# Patient Record
Sex: Female | Born: 1945 | Race: White | Hispanic: No | Smoking: Never smoker
Health system: Southern US, Community
[De-identification: ages and names within clinical notes are randomized; demographics above are authoritative.]

## PROBLEM LIST (undated history)

## (undated) DIAGNOSIS — C169 Malignant neoplasm of stomach, unspecified: Secondary | ICD-10-CM

## (undated) DIAGNOSIS — K219 Gastro-esophageal reflux disease without esophagitis: Secondary | ICD-10-CM

## (undated) DIAGNOSIS — D638 Anemia in other chronic diseases classified elsewhere: Secondary | ICD-10-CM

## (undated) DIAGNOSIS — I10 Essential (primary) hypertension: Secondary | ICD-10-CM

## (undated) DIAGNOSIS — E785 Hyperlipidemia, unspecified: Secondary | ICD-10-CM

## (undated) DIAGNOSIS — I2510 Atherosclerotic heart disease of native coronary artery without angina pectoris: Secondary | ICD-10-CM

## (undated) DIAGNOSIS — C801 Malignant (primary) neoplasm, unspecified: Secondary | ICD-10-CM

## (undated) HISTORY — PX: JOINT REPLACEMENT: SHX530

## (undated) HISTORY — PX: ABDOMINAL HYSTERECTOMY: SHX81

## (undated) HISTORY — PX: OTHER SURGICAL HISTORY: SHX169

## (undated) HISTORY — PX: BREAST SURGERY: SHX581

## (undated) HISTORY — PX: KNEE ARTHROPLASTY: SHX992

---

## 2008-10-21 ENCOUNTER — Ambulatory Visit: Payer: Self-pay | Admitting: Internal Medicine

## 2008-11-09 ENCOUNTER — Ambulatory Visit: Payer: Self-pay | Admitting: Internal Medicine

## 2008-11-24 ENCOUNTER — Ambulatory Visit: Payer: Self-pay | Admitting: Surgery

## 2008-12-06 ENCOUNTER — Ambulatory Visit: Payer: Self-pay | Admitting: Surgery

## 2008-12-10 ENCOUNTER — Ambulatory Visit: Payer: Self-pay | Admitting: Surgery

## 2009-01-03 ENCOUNTER — Ambulatory Visit: Payer: Self-pay | Admitting: Surgery

## 2009-01-07 ENCOUNTER — Ambulatory Visit: Payer: Self-pay | Admitting: Surgery

## 2009-01-09 ENCOUNTER — Ambulatory Visit: Payer: Self-pay | Admitting: Oncology

## 2009-01-19 ENCOUNTER — Ambulatory Visit: Payer: Self-pay | Admitting: Oncology

## 2009-02-09 ENCOUNTER — Ambulatory Visit: Payer: Self-pay | Admitting: Oncology

## 2009-03-11 ENCOUNTER — Ambulatory Visit: Payer: Self-pay | Admitting: Oncology

## 2009-04-11 ENCOUNTER — Ambulatory Visit: Payer: Self-pay | Admitting: Oncology

## 2009-05-11 ENCOUNTER — Ambulatory Visit: Payer: Self-pay | Admitting: Oncology

## 2009-07-12 ENCOUNTER — Ambulatory Visit: Payer: Self-pay | Admitting: Oncology

## 2009-08-02 ENCOUNTER — Ambulatory Visit: Payer: Self-pay | Admitting: Oncology

## 2009-08-09 ENCOUNTER — Ambulatory Visit: Payer: Self-pay | Admitting: Oncology

## 2009-09-09 ENCOUNTER — Ambulatory Visit: Payer: Self-pay | Admitting: Radiation Oncology

## 2009-09-29 ENCOUNTER — Ambulatory Visit: Payer: Self-pay | Admitting: Oncology

## 2009-10-09 ENCOUNTER — Ambulatory Visit: Payer: Self-pay | Admitting: Radiation Oncology

## 2009-11-01 ENCOUNTER — Ambulatory Visit: Payer: Self-pay | Admitting: Oncology

## 2009-11-09 ENCOUNTER — Ambulatory Visit: Payer: Self-pay | Admitting: Radiation Oncology

## 2009-11-09 ENCOUNTER — Ambulatory Visit: Payer: Self-pay | Admitting: Oncology

## 2010-01-09 ENCOUNTER — Ambulatory Visit: Payer: Self-pay | Admitting: Oncology

## 2010-02-02 ENCOUNTER — Ambulatory Visit: Payer: Self-pay | Admitting: Oncology

## 2010-02-09 ENCOUNTER — Ambulatory Visit: Payer: Self-pay | Admitting: Oncology

## 2010-08-15 ENCOUNTER — Ambulatory Visit: Payer: Self-pay | Admitting: Oncology

## 2010-08-17 ENCOUNTER — Ambulatory Visit: Payer: Self-pay | Admitting: Oncology

## 2010-09-10 ENCOUNTER — Ambulatory Visit: Payer: Self-pay | Admitting: Oncology

## 2010-10-10 ENCOUNTER — Ambulatory Visit: Payer: Self-pay | Admitting: Oncology

## 2011-02-20 ENCOUNTER — Ambulatory Visit: Payer: Self-pay | Admitting: Oncology

## 2011-03-12 ENCOUNTER — Ambulatory Visit: Payer: Self-pay | Admitting: Oncology

## 2011-08-20 ENCOUNTER — Ambulatory Visit: Payer: Self-pay | Admitting: Oncology

## 2011-08-21 ENCOUNTER — Ambulatory Visit: Payer: Self-pay | Admitting: Oncology

## 2011-09-10 ENCOUNTER — Ambulatory Visit: Payer: Self-pay | Admitting: Oncology

## 2011-10-10 ENCOUNTER — Ambulatory Visit: Payer: Self-pay | Admitting: Oncology

## 2012-02-19 ENCOUNTER — Ambulatory Visit: Payer: Self-pay | Admitting: Oncology

## 2012-03-11 ENCOUNTER — Ambulatory Visit: Payer: Self-pay | Admitting: Oncology

## 2012-08-09 ENCOUNTER — Ambulatory Visit: Payer: Self-pay | Admitting: Oncology

## 2012-09-09 ENCOUNTER — Ambulatory Visit: Payer: Self-pay | Admitting: Oncology

## 2012-10-09 ENCOUNTER — Ambulatory Visit: Payer: Self-pay | Admitting: Oncology

## 2013-03-10 ENCOUNTER — Ambulatory Visit: Payer: Self-pay | Admitting: Oncology

## 2013-04-02 ENCOUNTER — Ambulatory Visit: Payer: Self-pay | Admitting: Oncology

## 2014-04-05 ENCOUNTER — Ambulatory Visit: Payer: Self-pay | Admitting: Oncology

## 2014-04-09 ENCOUNTER — Ambulatory Visit: Payer: Self-pay | Admitting: Oncology

## 2014-04-11 ENCOUNTER — Ambulatory Visit: Payer: Self-pay | Admitting: Oncology

## 2015-03-05 ENCOUNTER — Encounter: Payer: Self-pay | Admitting: *Deleted

## 2015-03-05 ENCOUNTER — Emergency Department
Admission: EM | Admit: 2015-03-05 | Discharge: 2015-03-06 | Disposition: A | Discharge status: Home / Self Care | Payer: Medicare Other | Attending: Emergency Medicine | Admitting: Emergency Medicine

## 2015-03-05 DIAGNOSIS — N390 Urinary tract infection, site not specified: Secondary | ICD-10-CM | POA: Insufficient documentation

## 2015-03-05 DIAGNOSIS — R1110 Vomiting, unspecified: Secondary | ICD-10-CM

## 2015-03-05 DIAGNOSIS — K2970 Gastritis, unspecified, without bleeding: Secondary | ICD-10-CM | POA: Diagnosis not present

## 2015-03-05 DIAGNOSIS — C787 Secondary malignant neoplasm of liver and intrahepatic bile duct: Secondary | ICD-10-CM | POA: Diagnosis not present

## 2015-03-05 DIAGNOSIS — I10 Essential (primary) hypertension: Secondary | ICD-10-CM | POA: Diagnosis not present

## 2015-03-05 DIAGNOSIS — R1011 Right upper quadrant pain: Secondary | ICD-10-CM | POA: Diagnosis present

## 2015-03-05 DIAGNOSIS — R109 Unspecified abdominal pain: Secondary | ICD-10-CM

## 2015-03-05 HISTORY — DX: Malignant (primary) neoplasm, unspecified: C80.1

## 2015-03-05 HISTORY — DX: Essential (primary) hypertension: I10

## 2015-03-05 LAB — COMPREHENSIVE METABOLIC PANEL
ALT: 19 U/L (ref 14–54)
AST: 22 U/L (ref 15–41)
Albumin: 2.8 g/dL — ABNORMAL LOW (ref 3.5–5.0)
Alkaline Phosphatase: 199 U/L — ABNORMAL HIGH (ref 38–126)
Anion gap: 7 (ref 5–15)
BILIRUBIN TOTAL: 1 mg/dL (ref 0.3–1.2)
BUN: 9 mg/dL (ref 6–20)
CO2: 25 mmol/L (ref 22–32)
Calcium: 8.8 mg/dL — ABNORMAL LOW (ref 8.9–10.3)
Chloride: 105 mmol/L (ref 101–111)
Creatinine, Ser: 0.56 mg/dL (ref 0.44–1.00)
GFR calc Af Amer: >60 mL/min (ref >60)
Glucose, Bld: 117 mg/dL — ABNORMAL HIGH (ref 65–99)
Potassium: 3.7 mmol/L (ref 3.5–5.1)
Sodium: 137 mmol/L (ref 135–145)
TOTAL PROTEIN: 7.3 g/dL (ref 6.5–8.1)

## 2015-03-05 LAB — URINALYSIS COMPLETE WITH MICROSCOPIC (ARMC ONLY)
BILIRUBIN URINE: NEGATIVE
Bacteria, UA: NONE SEEN
GLUCOSE, UA: NEGATIVE mg/dL
Nitrite: NEGATIVE
Protein, ur: 30 mg/dL — AB
Specific Gravity, Urine: 1.024 (ref 1.005–1.030)
pH: 5 (ref 5.0–8.0)

## 2015-03-05 LAB — LIPASE, BLOOD: LIPASE: 12 U/L — AB (ref 22–51)

## 2015-03-05 MED ORDER — KETOROLAC TROMETHAMINE 60 MG/2ML IM SOLN
INTRAMUSCULAR | Status: AC
  Filled 2015-03-05: qty 2

## 2015-03-05 MED ORDER — DIPHENHYDRAMINE HCL 50 MG PO CAPS
ORAL_CAPSULE | ORAL | Status: AC
  Filled 2015-03-05: qty 1

## 2015-03-05 MED ORDER — ORPHENADRINE CITRATE 30 MG/ML IJ SOLN
INTRAMUSCULAR | Status: AC
  Filled 2015-03-05: qty 2

## 2015-03-05 MED ORDER — METOCLOPRAMIDE HCL 10 MG PO TABS
ORAL_TABLET | ORAL | Status: AC
  Filled 2015-03-05: qty 1

## 2015-03-05 NOTE — ED Notes (Signed)
Pt reports abdominal pain, radiating to back. Pt and family believe it is her gallbladder. Reports pain 2-3 weeks. Vomiting.

## 2015-03-06 ENCOUNTER — Emergency Department: Payer: Medicare Other

## 2015-03-06 ENCOUNTER — Encounter: Payer: Self-pay | Admitting: Emergency Medicine

## 2015-03-06 LAB — CBC WITH DIFFERENTIAL/PLATELET
Basophils Absolute: 0.1 10*3/uL (ref 0–0.1)
Eosinophils Absolute: 0.1 10*3/uL (ref 0–0.7)
HEMATOCRIT: 27.8 % — AB (ref 35.0–47.0)
Hemoglobin: 8.9 g/dL — ABNORMAL LOW (ref 12.0–16.0)
Lymphs Abs: 2 10*3/uL (ref 1.0–3.6)
MCH: 25.6 pg — ABNORMAL LOW (ref 26.0–34.0)
MCHC: 31.9 g/dL — AB (ref 32.0–36.0)
MCV: 80.4 fL (ref 80.0–100.0)
MONO ABS: 3.7 10*3/uL — AB (ref 0.2–0.9)
NEUTROS ABS: 18.2 10*3/uL — AB (ref 1.4–6.5)
Neutrophils Relative %: 76 %
PLATELETS: 395 10*3/uL (ref 150–440)
RBC: 3.46 MIL/uL — ABNORMAL LOW (ref 3.80–5.20)
RDW: 17.3 % — AB (ref 11.5–14.5)
WBC: 24.1 10*3/uL — ABNORMAL HIGH (ref 3.6–11.0)

## 2015-03-06 MED ORDER — BENZONATATE 100 MG PO CAPS: 100.0000 mg | ORAL_CAPSULE | Freq: Four times a day (QID) | ORAL | Status: DC | PRN

## 2015-03-06 MED ORDER — BENZONATATE 100 MG PO CAPS
100.0000 mg | ORAL_CAPSULE | Freq: Once | ORAL | Status: AC
  Administered 2015-03-06: 100 mg via ORAL
  Filled 2015-03-06: qty 1

## 2015-03-06 MED ORDER — CEPHALEXIN 500 MG PO CAPS: 500.0000 mg | ORAL_CAPSULE | Freq: Four times a day (QID) | ORAL | Status: AC

## 2015-03-06 MED ORDER — IOHEXOL 300 MG/ML  SOLN
100.0000 mL | Freq: Once | INTRAMUSCULAR | Status: AC | PRN
  Administered 2015-03-06: 100 mL via INTRAVENOUS

## 2015-03-06 MED ORDER — DEXTROSE 5 % IV SOLN
1.0000 g | Freq: Once | INTRAVENOUS | Status: AC
  Administered 2015-03-06: 1 g via INTRAVENOUS
  Filled 2015-03-06: qty 10

## 2015-03-06 MED ORDER — ONDANSETRON 4 MG PO TBDP: 4.0000 mg | ORAL_TABLET | Freq: Three times a day (TID) | ORAL | Status: DC | PRN

## 2015-03-06 MED ORDER — HYDROCODONE-ACETAMINOPHEN 5-325 MG PO TABS: 1.0000 | ORAL_TABLET | Freq: Four times a day (QID) | ORAL | Status: DC | PRN

## 2015-03-06 MED ORDER — ONDANSETRON HCL 4 MG/2ML IJ SOLN
4.0000 mg | Freq: Once | INTRAMUSCULAR | Status: AC
  Administered 2015-03-06: 4 mg via INTRAVENOUS
  Filled 2015-03-06: qty 2

## 2015-03-06 MED ORDER — IOHEXOL 240 MG/ML SOLN
25.0000 mL | Freq: Once | INTRAMUSCULAR | Status: AC | PRN
  Administered 2015-03-06: 25 mL via ORAL

## 2015-03-06 MED ORDER — SODIUM CHLORIDE 0.9 % IV BOLUS (SEPSIS)
1000.0000 mL | Freq: Once | INTRAVENOUS | Status: AC
  Administered 2015-03-06: 1000 mL via INTRAVENOUS

## 2015-03-06 MED ORDER — MORPHINE SULFATE (PF) 4 MG/ML IV SOLN
4.0000 mg | Freq: Once | INTRAVENOUS | Status: AC
  Administered 2015-03-06: 2 mg via INTRAVENOUS
  Filled 2015-03-06: qty 1

## 2015-03-06 NOTE — ED Notes (Signed)
Pt reports upper abd pain x 2-3 weeks.  Area tender to touch around epigastric area.  Pain described as sharp.  Pt denies diarrhea, reports n/v.  Pt NAD at this time.

## 2015-03-06 NOTE — Discharge Instructions (Signed)
Abdominal Pain Many things can cause abdominal pain. Usually, abdominal pain is not caused by a disease and will improve without treatment. It can often be observed and treated at home. Your health care provider will do a physical exam and possibly order blood tests and X-rays to help determine the seriousness of your pain. However, in many cases, more time must pass before a clear cause of the pain can be found. Before that point, your health care provider may not know if you need more testing or further treatment. HOME CARE INSTRUCTIONS  Monitor your abdominal pain for any changes. The following actions may help to alleviate any discomfort you are experiencing:  Only take over-the-counter or prescription medicines as directed by your health care provider.  Do not take laxatives unless directed to do so by your health care provider.  Try a clear liquid diet (broth, tea, or water) as directed by your health care provider. Slowly move to a bland diet as tolerated. SEEK MEDICAL CARE IF:  You have unexplained abdominal pain.  You have abdominal pain associated with nausea or diarrhea.  You have pain when you urinate or have a bowel movement.  You experience abdominal pain that wakes you in the night.  You have abdominal pain that is worsened or improved by eating food.  You have abdominal pain that is worsened with eating fatty foods.  You have a fever. SEEK IMMEDIATE MEDICAL CARE IF:   Your pain does not go away within 2 hours.  You keep throwing up (vomiting).  Your pain is felt only in portions of the abdomen, such as the right side or the left lower portion of the abdomen.  You pass bloody or black tarry stools. MAKE SURE YOU:  Understand these instructions.   Will watch your condition.   Will get help right away if you are not doing well or get worse.  Document Released: 03/07/2005 Document Revised: 06/02/2013 Document Reviewed: 02/04/2013 [facility] Patient Information  2015 [place], [place]. This information is not intended to replace advice given to you by your health care provider. Make sure you discuss any questions you have with your health care provider.  Gastritis, Adult Gastritis is soreness and swelling (inflammation) of the lining of the stomach. Gastritis can develop as a sudden onset (acute) or long-term (chronic) condition. If gastritis is not treated, it can lead to stomach bleeding and ulcers. CAUSES  Gastritis occurs when the stomach lining is weak or damaged. Digestive juices from the stomach then inflame the weakened stomach lining. The stomach lining may be weak or damaged due to viral or bacterial infections. One common bacterial infection is the Helicobacter pylori infection. Gastritis can also result from excessive alcohol consumption, taking certain medicines, or having too much acid in the stomach.  SYMPTOMS  In some cases, there are no symptoms. When symptoms are present, they may include:  Pain or a burning sensation in the upper abdomen.  Nausea.  Vomiting.  An uncomfortable feeling of fullness after eating. DIAGNOSIS  Your caregiver may suspect you have gastritis based on your symptoms and a physical exam. To determine the cause of your gastritis, your caregiver may perform the following:  Blood or stool tests to check for the H pylori bacterium.  Gastroscopy. A thin, flexible tube (endoscope) is passed down the esophagus and into the stomach. The endoscope has a light and camera on the end. Your caregiver uses the endoscope to view the inside of the stomach.  Taking a tissue sample (biopsy)  from the stomach to examine under a microscope. TREATMENT  Depending on the cause of your gastritis, medicines may be prescribed. If you have a bacterial infection, such as an H pylori infection, antibiotics may be given. If your gastritis is caused by too much acid in the stomach, H2 blockers or antacids may be given. Your caregiver may  recommend that you stop taking aspirin, ibuprofen, or other nonsteroidal anti-inflammatory drugs (NSAIDs). HOME CARE INSTRUCTIONS  Only take over-the-counter or prescription medicines as directed by your caregiver.  If you were given antibiotic medicines, take them as directed. Finish them even if you start to feel better.  Drink enough fluids to keep your urine clear or pale yellow.  Avoid foods and drinks that make your symptoms worse, such as:  Caffeine or alcoholic drinks.  Chocolate.  Peppermint or mint flavorings.  Garlic and onions.  Spicy foods.  Citrus fruits, such as oranges, lemons, or limes.  Tomato-based foods such as sauce, chili, salsa, and pizza.  Fried and fatty foods.  Eat small, frequent meals instead of large meals. SEEK IMMEDIATE MEDICAL CARE IF:   You have black or dark red stools.  You vomit blood or material that looks like coffee grounds.  You are unable to keep fluids down.  Your abdominal pain gets worse.  You have a fever.  You do not feel better after 1 week.  You have any other questions or concerns. MAKE SURE YOU:  Understand these instructions.  Will watch your condition.  Will get help right away if you are not doing well or get worse. Document Released: 05/22/2001 Document Revised: 11/27/2011 Document Reviewed: 07/11/2011 [facility] Patient Information 2015 [place], [place]. This information is not intended to replace advice given to you by your health care provider. Make sure you discuss any questions you have with your health care provider.  Metastatic Cancer, Questions and Answers KEY POINTS  Cancer happens when cells become abnormal and grow without control.  Where the cancer started is called the primary cancer or the primary tumor.  Metastatic cancer happens when cancer cells spread from the place where it started to other parts of the body.  When cancer spreads, the metastatic cancer keeps the same type of cells and  the same name as the primary tumor.  The most common sites of metastasis are the lungs, bones, liver, and brain.  Treatment for metastatic cancer usually depends on the type of cancer. It also depends on the size and location of the metastasis. WHAT IS CANCER?   Cancer is a group of many related diseases. All cancers begin in cells. Cells are the building blocks that make up tissues. Cancer that arises from organs and solid tissues is called a solid tumor. Cancer that begins in blood cells is called leukemia, multiple myeloma, or lymphoma.  Normally, cells grow and divide to form new cells as the body needs them. When cells grow old and die, new cells take their place. Sometimes this orderly process goes wrong. New cells form when the body does not need them. Old cells do not die when they should.  The extra cells form a mass of tissue. This is called a growth or tumor. Tumors can be either not cancerous (benign) or cancerous (malignant). Benign tumors do not spread to other parts of the body. They are rarely a threat to life. Malignant tumors can spread (metastasize) and may be life threatening. WHAT IS PRIMARY CANCER?  Cancer can begin in any organ or tissue of the  body. The original tumor is called the primary cancer or primary tumor. It is usually named for the part of the body or the type of cell in which it begins. WHAT IS METASTASIS, AND HOW DOES IT HAPPEN?   Metastasis means the spread of cancer. Cancer cells can break away from a primary tumor and enter the bloodstream or lymphatic system. This is the system that produces, stores, and carries the cells that fight infections. That is how cancer cells spread to other parts of the body.  When cancer cells spread and form a new tumor in a different organ, the new tumor is a metastatic tumor. The cells in the metastatic tumor come from the original tumor. For example, if breast cancer spreads to the lungs, the metastatic tumor in the lung is  made up of cancerous breast cells. It is not made of lung cells. In this case, the disease in the lungs is metastatic breast cancer (not lung cancer). Under a microscope, metastatic breast cancer cells generally look the same as the cancer cells in the breast. [place]?   Cancer cells can spread to almost any part of the body. Cancer cells frequently spread to lymph nodes (rounded masses of lymphatic tissue) near the primary tumor (regional lymph nodes). This is called lymph node involvement or regional disease. Cancer that spreads to other organs or to lymph nodes far from the primary tumor is called metastatic disease. Caregivers sometimes also call this distant disease.  The most common sites of metastasis from solid tumors are the lungs, bones, liver, and brain. Some cancers tend to spread to certain parts of the body. For example, lung cancer often metastasizes to the brain or bones. Colon cancer often spreads to the liver. Prostate cancer tends to spread to the bones. Breast cancer commonly spreads to the bones, lungs, liver, or brain. But each of these cancers can spread to other parts of the body as well.  Because blood cells travel throughout the body, leukemia, multiple myeloma, and lymphoma cells are usually not localized when the cancer is diagnosed. Tumor cells may be found in the blood, several lymph nodes, or other parts of the body such as the liver or bones. This type of spread is not referred to as metastasis. ARE THERE SYMPTOMS OF METASTATIC CANCER?   Some people with metastatic cancer do not have symptoms. Their metastases are found by X-rays and other tests performed for other reasons.  When symptoms of metastatic cancer occur, the type and frequency of the symptoms will depend on the size and location of the metastasis. For example, cancer that spreads to the bones is likely to cause pain and can lead to bone fractures. Cancer that spreads to the brain can cause a  variety of symptoms. These include headaches, seizures, and unsteadiness. Shortness of breath may be a sign of lung involvement. Abdominal swelling or yellowing of the skin (jaundice) can indicate that cancer has spread to the liver.  Sometimes a person's primary cancer is discovered only after the metastatic tumor causes symptoms. For example, a man whose prostate cancer has spread to the bones in his pelvis may have lower back pain (caused by the cancer in his bones) before he experiences any symptoms from the primary tumor in his prostate. HOW DOES THE CAREGIVER KNOW WHETHER A CANCER IS PRIMARY OR A METASTATIC TUMOR?  To determine whether a tumor is primary or metastatic, the tumor will be examined under a microscope. In general, cancer cells  look like abnormal versions of cells in the tissue where the cancer began. Using specialized diagnostic tests, a trained person is often able to tell where the cancer cells came from. Markers or antigens found in or on the cancer cells can indicate the primary site of the cancer.  Metastatic cancers may be found before or at the same time as the primary tumor, or months or years later. When a new tumor is found in a patient who has been treated for cancer in the past, it is more often a metastasis than another primary tumor. IS IT POSSIBLE TO HAVE A METASTATIC TUMOR WITHOUT HAVING A PRIMARY CANCER?  No. A metastatic tumor always starts from cancer cells in another part of the body. In most cases, when a metastatic tumor is found first, the primary tumor can be found. The search for the primary tumor may involve lab tests, X-rays, and other procedures. However, in a small number of cases, a metastatic tumor is diagnosed but the primary tumor cannot be found, in spite of extensive tests. The tumor is metastatic because the cells are not like those in the organ or tissue in which the tumor is found. The primary tumor is called unknown or hidden (occult). The patient is  said to have cancer of unknown primary origin (CUP). Because diagnostic techniques are constantly improving, the number of cases of CUP is going down.  WHAT TREATMENTS ARE USED FOR METASTATIC CANCER?   When cancer has metastasized, it may be treated with:  Chemotherapy.  Radiation therapy.  Biological therapy.  Hormone therapy.  Surgery.  Cryosurgery.  A combination of these.  The choice of treatment generally depends on the:  Type of primary cancer.  Size and location of the metastasis.  Patient's age and general health.  Types of treatments the patient has had in the past. In patients with CUP, it is possible to treat the disease even though the primary tumor has not been located. The goal of treatment may be to control the cancer, or to relieve symptoms or side effects of treatment. ARE NEW TREATMENTS FOR METASTATIC CANCER BEING DEVELOPED?  Yes, many new cancer treatments are under study. To develop new treatments, the [facility] sponsors clinical trials (research studies) with cancer patients in many hospitals, universities, medical schools, and cancer centers around the country. Clinical trials are a critical step in the improvement of treatment. Before any new treatment can be recommended for general use, doctors conduct studies to find out whether the treatment is both safe for patients and effective against the disease. The results of such studies have led to progress not only in the treatment of cancer, but in the detection, diagnosis, and prevention of the disease as well. Patients interested in taking part in a clinical trial should talk with their caregivers. [place] ([place]): www.cancer.gov Document Released: 10/02/2004 Document Revised: 08/20/2011 Document Reviewed: 05/20/2008 [facility] Patient Information 2015 [place], [place]. This information is not intended to replace advice given to you by your health care provider. Make sure you discuss  any questions you have with your health care provider.  Urinary Tract Infection Urinary tract infections (UTIs) can develop anywhere along your urinary tract. Your urinary tract is your body's drainage system for removing wastes and extra water. Your urinary tract includes two kidneys, two ureters, a bladder, and a urethra. Your kidneys are a pair of bean-shaped organs. Each kidney is about the size of your fist. They are located below your ribs, one  on each side of your spine. CAUSES Infections are caused by microbes, which are microscopic organisms, including fungi, viruses, and bacteria. These organisms are so small that they can only be seen through a microscope. Bacteria are the microbes that most commonly cause UTIs. SYMPTOMS  Symptoms of UTIs may vary by age and gender of the patient and by the location of the infection. Symptoms in young women typically include a frequent and intense urge to urinate and a painful, burning feeling in the bladder or urethra during urination. Older women and men are more likely to be tired, shaky, and weak and have muscle aches and abdominal pain. A fever may mean the infection is in your kidneys. Other symptoms of a kidney infection include pain in your back or sides below the ribs, nausea, and vomiting. DIAGNOSIS To diagnose a UTI, your caregiver will ask you about your symptoms. Your caregiver also will ask to provide a urine sample. The urine sample will be tested for bacteria and white blood cells. White blood cells are made by your body to help fight infection. TREATMENT  Typically, UTIs can be treated with medication. Because most UTIs are caused by a bacterial infection, they usually can be treated with the use of antibiotics. The choice of antibiotic and length of treatment depend on your symptoms and the type of bacteria causing your infection. HOME CARE INSTRUCTIONS  If you were prescribed antibiotics, take them exactly as your caregiver instructs you.  Finish the medication even if you feel better after you have only taken some of the medication.  Drink enough water and fluids to keep your urine clear or pale yellow.  Avoid caffeine, tea, and carbonated beverages. They tend to irritate your bladder.  Empty your bladder often. Avoid holding urine for long periods of time.  Empty your bladder before and after sexual intercourse.  After a bowel movement, women should cleanse from front to back. Use each tissue only once. SEEK MEDICAL CARE IF:   You have back pain.  You develop a fever.  Your symptoms do not begin to resolve within 3 days. SEEK IMMEDIATE MEDICAL CARE IF:   You have severe back pain or lower abdominal pain.  You develop chills.  You have nausea or vomiting.  You have continued burning or discomfort with urination. MAKE SURE YOU:   Understand these instructions.  Will watch your condition.  Will get help right away if you are not doing well or get worse. Document Released: 03/07/2005 Document Revised: 11/27/2011 Document Reviewed: 07/06/2011 [facility] Patient Information 2015 [place], [place]. This information is not intended to replace advice given to you by your health care provider. Make sure you discuss any questions you have with your health care provider.

## 2015-03-06 NOTE — ED Provider Notes (Signed)
[facility] Emergency Department Provider Note  ____________________________________________  Time seen: Approximately 0009 AM  I have reviewed the triage vital signs and the nursing notes.   HISTORY  Chief Complaint Abdominal Pain and Back Pain    HPI Chloe Arnold is a 69 y.o. female who comes in with abdominal pain and vomiting. The patient reports that she has also been vomiting and has pain in her abdomen all the way around to her back. The patient reports that the pain started 3 weeks ago. She reports that she was trying to hold out until now. She reports that she is been vomiting and having abdominal pain on and off for 3 months. She reports that she vomits 3-4 times daily and that typically whenever she tries to eat she vomits 15 minutes later. The patient has lost some weight she denies any diarrhea. The patient reports that her pain is really bad at work today. Her pain currently is a 4 out of 10 in intensity. The patient has had a cough with no fever. She reports that her urine looked dark today and is never look like that before. The patient reports that she was getting ready to follow-up with a primary care physician to evaluate her symptoms but decided to come in for evaluation today.   Past Medical History  Diagnosis Date  . Cancer     breast  . Hypertension     There are no active problems to display for this patient.   Past Surgical History  Procedure Laterality Date  . Breast surgery    . Abdominal hysterectomy      Current Outpatient Rx  Name  Route  Sig  Dispense  Refill  . acetaminophen (TYLENOL) 325 MG tablet   Oral   Take 650 mg by mouth every 6 (six) hours as needed for moderate pain, fever or headache.         . benzonatate (TESSALON PERLES) 100 MG capsule   Oral   Take 1 capsule (100 mg total) by mouth every 6 (six) hours as needed for cough.   15 capsule   0   . HYDROcodone-acetaminophen (NORCO/VICODIN) 5-325 MG per  tablet   Oral   Take 1 tablet by mouth every 6 (six) hours as needed for moderate pain.   12 tablet   0   . ondansetron (ZOFRAN ODT) 4 MG disintegrating tablet   Oral   Take 1 tablet (4 mg total) by mouth every 8 (eight) hours as needed for nausea or vomiting.   20 tablet   0     Allergies Review of patient's allergies indicates no known allergies.  History reviewed. No pertinent family history.  Social History Social History  Substance Use Topics  . Smoking status: Never Smoker   . Smokeless tobacco: None  . Alcohol Use: No    Review of Systems Constitutional: No fever/chills Eyes: No visual changes. ENT: No sore throat. Cardiovascular: Denies chest pain. Respiratory: Denies shortness of breath. Gastrointestinal:  abdominal pain.  nausea,  vomiting.  No diarrhea.  No constipation. Genitourinary: dark looking urine Musculoskeletal: Negative for back pain. Skin: Negative for rash. Neurological: Negative for headaches, focal weakness or numbness.  10-point ROS otherwise negative.  ____________________________________________   PHYSICAL EXAM:  VITAL SIGNS: ED Triage Vitals  Enc Vitals Group     BP 03/06/15 0003 184/78 mmHg     Pulse Rate 03/06/15 0003 110     Resp 03/06/15 0003 16  Temp 03/06/15 0209 98.6 F (37 C)     Temp Source 03/06/15 0209 Oral     SpO2 03/06/15 0003 96 %     Weight --      Height --      Head Cir --      Peak Flow --      Pain Score 03/05/15 2012 6     Pain Loc --      Pain Edu? --      Excl. in [place]? --     Constitutional: Alert and oriented. Well appearing and in mild distress. Eyes: Conjunctivae are normal. PERRL. EOMI. Head: Atraumatic. Nose: No congestion/rhinnorhea. Mouth/Throat: Mucous membranes are moist.  Oropharynx non-erythematous. Cardiovascular: Normal rate, regular rhythm. Grossly normal heart sounds.  Good peripheral circulation. Respiratory: Normal respiratory effort.  No retractions. Lungs  CTAB. Gastrointestinal: Soft with epigastric and RUQ tenderness to palpation. No distention. Positive bowel sounds Musculoskeletal: No lower extremity tenderness nor edema.   Neurologic:  Normal speech and language.  Skin:  Skin is warm, dry and intact.  Psychiatric: Mood and affect are normal.  ____________________________________________   LABS (all labs ordered are listed, but only abnormal results are displayed)  Labs Reviewed  COMPREHENSIVE METABOLIC PANEL - Abnormal; Notable for the following:    Glucose, Bld 117 (*)    Calcium 8.8 (*)    Albumin 2.8 (*)    Alkaline Phosphatase 199 (*)    All other components within normal limits  URINALYSIS COMPLETEWITH MICROSCOPIC ([facility] ONLY) - Abnormal; Notable for the following:    Color, Urine AMBER (*)    APPearance CLOUDY (*)    Ketones, ur TRACE (*)    Hgb urine dipstick 2+ (*)    Protein, ur 30 (*)    Leukocytes, UA 3+ (*)    Squamous Epithelial / LPF 6-30 (*)    All other components within normal limits  LIPASE, BLOOD - Abnormal; Notable for the following:    Lipase 12 (*)    All other components within normal limits  CBC WITH DIFFERENTIAL/PLATELET - Abnormal; Notable for the following:    WBC 24.1 (*)    RBC 3.46 (*)    Hemoglobin 8.9 (*)    HCT 27.8 (*)    MCH 25.6 (*)    MCHC 31.9 (*)    RDW 17.3 (*)    Neutro Abs 18.2 (*)    Monocytes Absolute 3.7 (*)    All other components within normal limits  URINE CULTURE  CBC WITH DIFFERENTIAL/PLATELET   ____________________________________________  EKG  none ____________________________________________  RADIOLOGY  [place] abd: Multiple liver metastases, nonvisualized gallbladder, mild biliary ductal dilatation. CT abd and pelvis: Multiple liver metastases, upper abdominal adenopathy most likely metastatic, probable gallbladder filled with sludge or tumor, mild diffuse gastric wall thickening. CXR: No active  disease ____________________________________________   PROCEDURES  Procedure(s) performed: None  Critical Care performed: No  ____________________________________________   INITIAL IMPRESSION / ASSESSMENT AND PLAN / ED COURSE  Pertinent labs & imaging results that were available during my care of the patient were reviewed by me and considered in my medical decision making (see chart for details).  This is a 69 year old female who comes in with 3 months of abdominal pain and vomiting. The patient reports her pain is in her epigastric and right upper quadrant area. Initially an ultrasound was done with a concern for cholecystitis and a white count of 24. The patient had what appeared to be metastases on her ultrasound so a CT  scan was done. Even after the CT scan and is still unsure what the primary cause of the patient's metastases and cancer are. The patient does appear to have urinary tract infection and will receive a dose of ceftriaxone. I discussed options with the patient and informed them that she probably needs a PET scan to determine the spread of the cancer. I did give the patient and offered to stay in the hospital or to be discharged and follow-up with her oncologist Dr. Grayland Ormond on Monday. The family and her friends did agree that with pain medication and nausea medicine they felt that the patient could be managed at home and follow-up. The patient has had some mild tachycardia but no chest pain or shortness of breath. She is also had a cough. The patient had some vital signs showing a respiratory rate in the 30s but the patient was breathing comfortably in the room aside from her coughing. Patient denies any shortness of breath denies any chest pain. She is breathing comfortably and her oxygen saturation is 96% on room air. The patient will be discharged to follow-up with her oncologist on Monday. ____________________________________________   FINAL CLINICAL IMPRESSION(S) / ED  DIAGNOSES  Final diagnoses:  Abdominal pain  Gastritis  Intractable vomiting with nausea, vomiting of unspecified type  Liver metastases  UTI (lower urinary tract infection)      Loney Hering, MD 03/06/15 [phone]

## 2015-03-07 LAB — URINE CULTURE: SPECIAL REQUESTS: NORMAL

## 2015-03-09 ENCOUNTER — Inpatient Hospital Stay: Payer: Medicare Other

## 2015-03-09 ENCOUNTER — Inpatient Hospital Stay: Payer: Medicare Other | Attending: Oncology | Admitting: Oncology

## 2015-03-09 VITALS — BP 148/81 | HR 81 | Temp 97.5°F | Resp 18 | Ht 62.99 in | Wt 171.7 lb

## 2015-03-09 DIAGNOSIS — Z170 Estrogen receptor positive status [ER+]: Secondary | ICD-10-CM | POA: Diagnosis not present

## 2015-03-09 DIAGNOSIS — Z9223 Personal history of estrogen therapy: Secondary | ICD-10-CM | POA: Insufficient documentation

## 2015-03-09 DIAGNOSIS — R160 Hepatomegaly, not elsewhere classified: Secondary | ICD-10-CM

## 2015-03-09 DIAGNOSIS — Z803 Family history of malignant neoplasm of breast: Secondary | ICD-10-CM | POA: Diagnosis not present

## 2015-03-09 DIAGNOSIS — I2510 Atherosclerotic heart disease of native coronary artery without angina pectoris: Secondary | ICD-10-CM | POA: Diagnosis not present

## 2015-03-09 DIAGNOSIS — I10 Essential (primary) hypertension: Secondary | ICD-10-CM | POA: Diagnosis not present

## 2015-03-09 DIAGNOSIS — C772 Secondary and unspecified malignant neoplasm of intra-abdominal lymph nodes: Secondary | ICD-10-CM | POA: Diagnosis not present

## 2015-03-09 DIAGNOSIS — R05 Cough: Secondary | ICD-10-CM | POA: Diagnosis not present

## 2015-03-09 DIAGNOSIS — Z86000 Personal history of in-situ neoplasm of breast: Secondary | ICD-10-CM | POA: Insufficient documentation

## 2015-03-09 DIAGNOSIS — Z79899 Other long term (current) drug therapy: Secondary | ICD-10-CM | POA: Insufficient documentation

## 2015-03-09 DIAGNOSIS — C787 Secondary malignant neoplasm of liver and intrahepatic bile duct: Secondary | ICD-10-CM | POA: Diagnosis present

## 2015-03-09 DIAGNOSIS — Z9221 Personal history of antineoplastic chemotherapy: Secondary | ICD-10-CM | POA: Diagnosis not present

## 2015-03-09 DIAGNOSIS — R971 Elevated cancer antigen 125 [CA 125]: Secondary | ICD-10-CM | POA: Insufficient documentation

## 2015-03-09 DIAGNOSIS — Z923 Personal history of irradiation: Secondary | ICD-10-CM | POA: Diagnosis not present

## 2015-03-09 LAB — APTT: APTT: 39 s — AB (ref 24–36)

## 2015-03-09 LAB — PROTIME-INR
INR: 1.24
PROTHROMBIN TIME: 15.8 s — AB (ref 11.4–15.0)

## 2015-03-09 NOTE — Progress Notes (Signed)
Patient was having epigastric pain that radiates to her back for 2-3 weeks when the pain became unbearable she went to the ER.  There were images performed at the ER where there were noted liver mets so they advised her to f/u with Dr. Grayland Ormond.  There ER MD prescribed Vicodin that helps her pain with a 4/10 pain scale and she has only taken 3 tablets.

## 2015-03-10 LAB — CANCER ANTIGEN 19-9

## 2015-03-10 LAB — CEA: CEA: 2 ng/mL (ref 0.0–4.7)

## 2015-03-10 LAB — CANCER ANTIGEN 27.29: CA 27.29: 27 U/mL (ref 0.0–38.6)

## 2015-03-10 LAB — AFP TUMOR MARKER: AFP TUMOR MARKER: 3.6 ng/mL (ref 0.0–8.3)

## 2015-03-10 LAB — CA 125: CA 125: 62.9 U/mL — ABNORMAL HIGH (ref 0.0–38.1)

## 2015-03-17 ENCOUNTER — Ambulatory Visit: Payer: Self-pay | Admitting: Family Medicine

## 2015-03-17 ENCOUNTER — Ambulatory Visit
Admission: RE | Admit: 2015-03-17 | Discharge: 2015-03-17 | Disposition: A | Discharge status: Home / Self Care | Payer: Medicare Other | Source: Ambulatory Visit | Attending: Diagnostic Radiology | Admitting: Diagnostic Radiology

## 2015-03-17 ENCOUNTER — Other Ambulatory Visit: Payer: Self-pay | Admitting: Radiology

## 2015-03-17 ENCOUNTER — Ambulatory Visit
Admission: RE | Admit: 2015-03-17 | Discharge: 2015-03-17 | Disposition: A | Discharge status: Home / Self Care | Payer: Medicare Other | Source: Ambulatory Visit | Attending: Oncology | Admitting: Oncology

## 2015-03-17 DIAGNOSIS — I700 Atherosclerosis of aorta: Secondary | ICD-10-CM | POA: Insufficient documentation

## 2015-03-17 DIAGNOSIS — Z853 Personal history of malignant neoplasm of breast: Secondary | ICD-10-CM | POA: Insufficient documentation

## 2015-03-17 DIAGNOSIS — R748 Abnormal levels of other serum enzymes: Secondary | ICD-10-CM | POA: Insufficient documentation

## 2015-03-17 DIAGNOSIS — R05 Cough: Secondary | ICD-10-CM | POA: Diagnosis not present

## 2015-03-17 DIAGNOSIS — I10 Essential (primary) hypertension: Secondary | ICD-10-CM | POA: Insufficient documentation

## 2015-03-17 DIAGNOSIS — K769 Liver disease, unspecified: Secondary | ICD-10-CM | POA: Insufficient documentation

## 2015-03-17 DIAGNOSIS — R160 Hepatomegaly, not elsewhere classified: Secondary | ICD-10-CM

## 2015-03-17 DIAGNOSIS — K7689 Other specified diseases of liver: Secondary | ICD-10-CM | POA: Insufficient documentation

## 2015-03-17 HISTORY — DX: Atherosclerotic heart disease of native coronary artery without angina pectoris: I25.10

## 2015-03-17 LAB — PROTIME-INR
INR: 1.2
Prothrombin Time: 15.4 seconds — ABNORMAL HIGH (ref 11.4–15.0)

## 2015-03-17 LAB — APTT: aPTT: 40 seconds — ABNORMAL HIGH (ref 24–36)

## 2015-03-17 MED ORDER — OXYCODONE HCL 5 MG PO TABS: 5.0000 mg | ORAL_TABLET | ORAL | Status: DC | PRN

## 2015-03-17 MED ORDER — FENTANYL CITRATE (PF) 100 MCG/2ML IJ SOLN
INTRAMUSCULAR | Status: AC | PRN
  Administered 2015-03-17 (×2): 25 ug via INTRAVENOUS

## 2015-03-17 MED ORDER — MIDAZOLAM HCL 5 MG/5ML IJ SOLN
INTRAMUSCULAR | Status: AC | PRN
  Administered 2015-03-17 (×2): 0.5 mg via INTRAVENOUS
  Administered 2015-03-17: 1 mg via INTRAVENOUS

## 2015-03-17 MED ORDER — SODIUM CHLORIDE 0.9 % IV SOLN
INTRAVENOUS | Status: DC
  Administered 2015-03-17: 09:00:00 via INTRAVENOUS

## 2015-03-17 NOTE — H&P (Signed)
Chief Complaint: Patient is scheduled for liver lesion biopsy  Referring Physician(s): Finnegan,Timothy J  History of Present Illness: Chloe Arnold is a 69 y.o. female with history of right breast cancer, treated with surgery and radiation approximately 6 years ago.  Patient has been complaining of abdominal pain with vomiting for one month.  Patient reports 30 lbs weight loss recently.  Patient evaluated in ED and found to have multiple liver lesions (concerning for metastatic disease).  Patient was also diagnosed with UTI and received treatment.  Patient has no pain today.  Complains of occasional cough.  Denies dysuria or voiding problems.  Complains of constipation.  Past Medical History  Diagnosis Date  . Cancer (HCC)     breast  . Hypertension   . Coronary artery disease     angioplasty  . Indigestion     Past Surgical History  Procedure Laterality Date  . Breast surgery    . Abdominal hysterectomy    . Goiter removed Bilateral   . Joint replacement    . Knee arthroplasty Left     Allergies: Review of patient's allergies indicates no known allergies.  Medications: Prior to Admission medications   Medication Sig Start Date End Date Taking? Authorizing Provider  cephALEXin (KEFLEX) 500 MG capsule Take 500 mg by mouth 4 (four) times daily.   Yes Historical Provider, MD  ondansetron (ZOFRAN ODT) 4 MG disintegrating tablet Take 1 tablet (4 mg total) by mouth every 8 (eight) hours as needed for nausea or vomiting. 03/06/15  Yes Loney Hering, MD  acetaminophen (TYLENOL) 325 MG tablet Take 650 mg by mouth every 6 (six) hours as needed for moderate pain, fever or headache.    Historical Provider, MD  benzonatate (TESSALON PERLES) 100 MG capsule Take 1 capsule (100 mg total) by mouth every 6 (six) hours as needed for cough. Patient not taking: Reported on 03/17/2015 03/06/15 03/05/16  Loney Hering, MD  HYDROcodone-acetaminophen (NORCO/VICODIN) 5-325 MG per tablet Take  1 tablet by mouth every 6 (six) hours as needed for moderate pain. 03/06/15   Loney Hering, MD     History reviewed. No pertinent family history.  Social History   Social History  . Marital Status: Widowed    Spouse Name: N/A  . Number of Children: N/A  . Years of Education: N/A   Social History Main Topics  . Smoking status: Never Smoker   . Smokeless tobacco: None  . Alcohol Use: No  . Drug Use: No  . Sexual Activity: Not Asked   Other Topics Concern  . None   Social History Narrative     Review of Systems  Constitutional: Positive for unexpected weight change.  Gastrointestinal: Positive for nausea, vomiting, abdominal pain and constipation.  Genitourinary: Negative.     Vital Signs: BP 154/75 mmHg  Pulse 95  Temp(Src) 98.5 F (36.9 C) (Oral)  Resp 18  Ht 5\' 2"  (1.575 m)  Wt 160 lb (72.576 kg)  BMI 29.26 kg/m2  SpO2 93%  Physical Exam  Constitutional: She is oriented to person, place, and time.  No distress  Cardiovascular: Normal rate, regular rhythm and normal heart sounds.   Pulmonary/Chest: Effort normal and breath sounds normal.  Abdominal: Soft. Bowel sounds are normal. There is tenderness.  Tenderness with palpation in right upper abdomen.  Neurological: She is alert and oriented to person, place, and time.    Mallampati Score:  MD Evaluation Airway: WNL Heart: WNL Abdomen: Other (comments) Abdomen comments:  Right abdominal pain, liver area Chest/ Lungs: WNL ASA  Classification: 2 Mallampati/Airway Score: One  Imaging: Ct Abdomen Pelvis W Contrast  03/06/2015   CLINICAL DATA:  Upper abdominal pain for the past 2-3 weeks. Tender to palpation in the epigastric area. Nausea and vomiting. History of breast cancer and chemotherapy and radiation therapy in 2011. Previous hysterectomy.  EXAM: CT ABDOMEN AND PELVIS WITH CONTRAST  TECHNIQUE: Multidetector CT imaging of the abdomen and pelvis was performed using the standard protocol following  bolus administration of intravenous contrast.  CONTRAST:  151mL OMNIPAQUE IOHEXOL 300 MG/ML  SOLN  COMPARISON:  Liver ultrasound obtained earlier today.  FINDINGS: Again demonstrated are a large number of liver masses. The largest individual mass measures 5.4 x 3.8 cm on image number 29. The spleen, pancreas, adrenal glands, kidneys, ureters and urinary bladder are unremarkable. Possible poorly defined gallbladder filled with heterogeneous medium to high density material, including on image number 37 and sagittal image number 82.  An enlarged portacaval lymph node is demonstrated with a short axis diameter of 12 mm on image number 29. There is also an enlarged gastrohepatic ligament node with a short axis diameter of 16 mm on image number 25.  Under distended stomach with mild diffuse wall thickening. No intestinal abnormalities. No evidence of appendicitis.  Surgically absent uterus. Normal appearing left ovary. The right ovary is difficult to differentiate from unopacified small bowel. Lumbar and lower thoracic spine degenerative changes. Atheromatous arterial calcifications. Clear lung bases.  IMPRESSION: 1. Multiple liver metastases. 2. Upper abdominal adenopathy, most likely metastatic. 3. Probable gallbladder filled with sludge or tumor. 4. Mild diffuse gastric wall thickening. This could be artifactual due to under distention of the stomach. Gastritis is also a possibility, especially given the patient's symptoms. A neoplastic process is less likely.   Electronically Signed   By: Claudie Revering M.D.   On: 03/06/2015 04:08   Dg Chest Portable 1 View  03/06/2015   CLINICAL DATA:  Nonproductive cough for 4 months. Tachypneic. History of hypertension.  EXAM: PORTABLE CHEST 1 VIEW  COMPARISON:  None.  FINDINGS: Normal heart size and pulmonary vascularity. No focal airspace disease or consolidation in the lungs. No blunting of costophrenic angles. No pneumothorax. Mediastinal contours appear intact. Calcification  of the aorta. Degenerative changes in the spine.  IMPRESSION: No active disease.   Electronically Signed   By: Lucienne Capers M.D.   On: 03/06/2015 05:21   US Abdomen Limited Ruq  03/06/2015   CLINICAL DATA:  Epigastric abdominal pain for the past 2-3 weeks. Elevated alkaline phosphatase. History right breast cancer diagnosed in 2011.  EXAM: US ABDOMEN LIMITED - RIGHT UPPER QUADRANT  COMPARISON:  Treatment planning chest CT dated 01/26/2009.  FINDINGS: Gallbladder:  Not definitely visualized.  Common bile duct:  Diameter: 8.1 mm.  Liver:  Large number of solid masses throughout the liver. These vary in size and shape. The largest is in the lateral segment of the left lobe, measuring 12.8 cm in maximum diameter.  IMPRESSION: 1. Multiple liver metastases. 2. Nonvisualized gallbladder. 3. Mild biliary ductal dilatation. This could be due to compression of the common duct, stricture or nonvisualized distal stone.   Electronically Signed   By: Claudie Revering M.D.   On: 03/06/2015 02:23    Labs:  CBC:  Recent Labs  03/06/15 0045  WBC 24.1*  HGB 8.9*  HCT 27.8*  PLT 395    COAGS:  Recent Labs  03/09/15 1154 03/17/15 0801  INR 1.24 1.20  APTT 39* 40*    BMP:  Recent Labs  03/05/15 2015  NA 137  K 3.7  CL 105  CO2 25  GLUCOSE 117*  BUN 9  CALCIUM 8.8*  CREATININE 0.56  GFRNONAA >60  GFRAA >60    LIVER FUNCTION TESTS:  Recent Labs  03/05/15 2015  BILITOT 1.0  AST 22  ALT 19  ALKPHOS 199*  PROT 7.3  ALBUMIN 2.8*    TUMOR MARKERS:  Recent Labs  03/09/15 1154  AFPTM 3.6  CEA 2.0  CA199 <1     Assessment and Plan:  69 yo with recent weight loss and multiple liver lesions.  Findings are concerning for metastatic disease.  History of breast cancer.  Discussed image guided liver lesion biopsy with patient and family.  Risks included bleeding and infection.  Informed consent obtained.  Plan for US guided liver lesion biopsy.    SignedCarylon Perches 03/17/2015, 9:01 AM

## 2015-03-17 NOTE — Procedures (Signed)
US guided core biopsies of left hepatic lesion.  3 cores obtained.  No immediate complication.

## 2015-03-20 NOTE — Progress Notes (Signed)
[place]  [phone] [phone]  ID: Chloe Arnold OB: 02/14/1885  MR#: [id number]  [facility]#:[id number]  Patient Care Team: No Pcp Per Patient as PCP - General (General Practice)  CHIEF COMPLAINT:  Chief Complaint  Patient presents with  . Follow-up    INTERVAL HISTORY: Patient is a 69 year old female whose last evaluated in clinic in October 2015. 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 Recently she was having increased epigastric pain for several weeks. Workup in the emergency room included a CT scan that revealed multiple lesions in her liver consistent with metastatic disease. Currently, her abdominal pain is improved. She has no neurologic complaints. She has a good appetite and denies weight loss. She denies any other pain. She denies any fevers. She has no chest pain, shortness of breath, or cough. She denies any nausea, vomiting, constipation, or diarrhea. She has no melanoma or hematochezia. She has no urinary complaints. Patient otherwise feels well and offers no further specific complaints.  REVIEW OF SYSTEMS:   Review of Systems  Constitutional: Negative for fever, weight loss and malaise/fatigue.  Respiratory: Negative.   Cardiovascular: Negative.   Gastrointestinal: Positive for abdominal pain. Negative for nausea, vomiting, diarrhea, constipation, blood in stool and melena.  Genitourinary: Negative.   Musculoskeletal: Negative.   Neurological: Negative for weakness.    As per HPI. Otherwise, a complete review of systems is negatve.  PAST MEDICAL HISTORY: Past Medical History  Diagnosis Date  . Cancer (HCC)     breast  . Hypertension   . Coronary artery disease     angioplasty  . Indigestion     PAST SURGICAL HISTORY: Past Surgical History  Procedure Laterality Date  . Breast surgery    . Abdominal hysterectomy    . Goiter removed Bilateral   . Joint replacement      . Knee arthroplasty Left     FAMILY HISTORY: Sister with breast cancer.  Also, diabetes and hypertension.     ADVANCED DIRECTIVES:    HEALTH MAINTENANCE: Social History  Substance Use Topics  . Smoking status: Never Smoker   . Smokeless tobacco: Not on file  . Alcohol Use: No     Colonoscopy:  PAP:  Bone density:  Lipid panel:  No Known Allergies  Current Outpatient Prescriptions  Medication Sig Dispense Refill  . acetaminophen (TYLENOL) 325 MG tablet Take 650 mg by mouth every 6 (six) hours as needed for moderate pain, fever or headache.    . benzonatate (TESSALON PERLES) 100 MG capsule Take 1 capsule (100 mg total) by mouth every 6 (six) hours as needed for cough. (Patient not taking: Reported on 03/17/2015) 15 capsule 0  . HYDROcodone-acetaminophen (NORCO/VICODIN) 5-325 MG per tablet Take 1 tablet by mouth every 6 (six) hours as needed for moderate pain. 12 tablet 0  . ondansetron (ZOFRAN ODT) 4 MG disintegrating tablet Take 1 tablet (4 mg total) by mouth every 8 (eight) hours as needed for nausea or vomiting. 20 tablet 0  . cephALEXin (KEFLEX) 500 MG capsule Take 500 mg by mouth 4 (four) times daily.     No current facility-administered medications for this visit.    OBJECTIVE: Filed Vitals:   03/09/15 1122  BP: 148/81  Pulse: 81  Temp: 97.5 F (36.4 C)  Resp: 18     Body mass index is 30.43 kg/(m^2).    ECOG FS:0 - Asymptomatic  General: Well-developed, well-nourished, no acute distress. Eyes:  Pink conjunctiva, anicteric sclera. HEENT: Normocephalic, moist mucous membranes, clear oropharnyx. Lungs: Clear to auscultation bilaterally. Heart: Regular rate and rhythm. No rubs, murmurs, or gallops. Abdomen: Soft, nontender, nondistended. No organomegaly noted, normoactive bowel sounds. Musculoskeletal: No edema, cyanosis, or clubbing. Neuro: Alert, answering all questions appropriately. Cranial nerves grossly intact. Skin: No rashes or petechiae noted. Psych:  Normal affect. Lymphatics: No cervical, calvicular, axillary or inguinal LAD.   LAB RESULTS:  Lab Results  Component Value Date   NA 137 03/05/2015   K 3.7 03/05/2015   CL 105 03/05/2015   CO2 25 03/05/2015   GLUCOSE 117* 03/05/2015   BUN 9 03/05/2015   CREATININE 0.56 03/05/2015   CALCIUM 8.8* 03/05/2015   PROT 7.3 03/05/2015   ALBUMIN 2.8* 03/05/2015   AST 22 03/05/2015   ALT 19 03/05/2015   ALKPHOS 199* 03/05/2015   BILITOT 1.0 03/05/2015   GFRNONAA >60 03/05/2015   GFRAA >60 03/05/2015    Lab Results  Component Value Date   WBC 24.1* 03/06/2015   NEUTROABS 18.2* 03/06/2015   HGB 8.9* 03/06/2015   HCT 27.8* 03/06/2015   MCV 80.4 03/06/2015   PLT 395 03/06/2015     STUDIES: Ct Abdomen Pelvis W Contrast  03/06/2015   CLINICAL DATA:  Upper abdominal pain for the past 2-3 weeks. Tender to palpation in the epigastric area. Nausea and vomiting. History of breast cancer and chemotherapy and radiation therapy in 2011. Previous hysterectomy.  EXAM: CT ABDOMEN AND PELVIS WITH CONTRAST  TECHNIQUE: Multidetector CT imaging of the abdomen and pelvis was performed using the standard protocol following bolus administration of intravenous contrast.  CONTRAST:  180mL OMNIPAQUE IOHEXOL 300 MG/ML  SOLN  COMPARISON:  Liver ultrasound obtained earlier today.  FINDINGS: Again demonstrated are a large number of liver masses. The largest individual mass measures 5.4 x 3.8 cm on image number 29. The spleen, pancreas, adrenal glands, kidneys, ureters and urinary bladder are unremarkable. Possible poorly defined gallbladder filled with heterogeneous medium to high density material, including on image number 37 and sagittal image number 82.  An enlarged portacaval lymph node is demonstrated with a short axis diameter of 12 mm on image number 29. There is also an enlarged gastrohepatic ligament node with a short axis diameter of 16 mm on image number 25.  Under distended stomach with mild diffuse wall  thickening. No intestinal abnormalities. No evidence of appendicitis.  Surgically absent uterus. Normal appearing left ovary. The right ovary is difficult to differentiate from unopacified small bowel. Lumbar and lower thoracic spine degenerative changes. Atheromatous arterial calcifications. Clear lung bases.  IMPRESSION: 1. Multiple liver metastases. 2. Upper abdominal adenopathy, most likely metastatic. 3. Probable gallbladder filled with sludge or tumor. 4. Mild diffuse gastric wall thickening. This could be artifactual due to under distention of the stomach. Gastritis is also a possibility, especially given the patient's symptoms. A neoplastic process is less likely.   Electronically Signed   By: Claudie Revering M.D.   On: 03/06/2015 04:08   [place] Core Biopsy  03/17/2015   CLINICAL DATA:  69 year old with multiple liver lesions. History of right breast cancer. Tissue diagnosis is needed.  EXAM: ULTRASOUND GUIDED LIVER LESION BIOPSY  Physician: Stephan Minister. Anselm Pancoast, MD  FLUOROSCOPY TIME:  None  MEDICATIONS: 2 mg versed, 50 mcg fentanyl. A radiology nurse monitored the patient for moderate sedation.  ANESTHESIA/SEDATION: Moderate sedation time:  15 minutes  PROCEDURE: The procedure was explained to the patient. The risks and benefits of the procedure were discussed  and the patient's questions were addressed. Informed consent was obtained from the patient. Patient was placed supine on the ultrasound examination table. The liver was evaluated with ultrasound. A lesion in the left hepatic lobe was targeted for biopsy. The anterior abdomen was prepped with chlorhexidine and sterile field was created. The skin and soft tissues were anesthetized with 1% lidocaine. A 17 gauge needle was directed into the anterior left hepatic lesion with ultrasound guidance. Three core biopsies were obtained with an 18 gauge core device. Specimens placed in formalin. Bandage placed over the puncture site.  FINDINGS: There are innumerable  hypoechoic and heterogeneous lesions throughout the liver. A round hypoechoic lesion in the left hepatic lobe was targeted. Needle position confirmed within the lesion on all occasions. No significant bleeding following the core biopsies.  Estimated blood loss: Minimal  COMPLICATIONS: None  IMPRESSION: Ultrasound-guided core biopsies of left hepatic lesion.   Electronically Signed   By: Markus Daft M.D.   On: 03/17/2015 10:47   Dg Chest Portable 1 View  03/06/2015   CLINICAL DATA:  Nonproductive cough for 4 months. Tachypneic. History of hypertension.  EXAM: PORTABLE CHEST 1 VIEW  COMPARISON:  None.  FINDINGS: Normal heart size and pulmonary vascularity. No focal airspace disease or consolidation in the lungs. No blunting of costophrenic angles. No pneumothorax. Mediastinal contours appear intact. Calcification of the aorta. Degenerative changes in the spine.  IMPRESSION: No active disease.   Electronically Signed   By: Lucienne Capers M.D.   On: 03/06/2015 05:21   US Abdomen Limited Ruq  03/06/2015   CLINICAL DATA:  Epigastric abdominal pain for the past 2-3 weeks. Elevated alkaline phosphatase. History right breast cancer diagnosed in 2011.  EXAM: US ABDOMEN LIMITED - RIGHT UPPER QUADRANT  COMPARISON:  Treatment planning chest CT dated 01/26/2009.  FINDINGS: Gallbladder:  Not definitely visualized.  Common bile duct:  Diameter: 8.1 mm.  Liver:  Large number of solid masses throughout the liver. These vary in size and shape. The largest is in the lateral segment of the left lobe, measuring 12.8 cm in maximum diameter.  IMPRESSION: 1. Multiple liver metastases. 2. Nonvisualized gallbladder. 3. Mild biliary ductal dilatation. This could be due to compression of the common duct, stricture or nonvisualized distal stone.   Electronically Signed   By: Claudie Revering M.D.   On: 03/06/2015 02:23    ASSESSMENT: History of DCIS, now with multiple liver lesions consistent with metastatic disease.  PLAN:    1. Liver  lesions: Consistent with metastatic disease, although unlikely from her low-grade DCIS. AFP, CA-19-9, CA-27-29, and CEA are all within normal limits. CA-125 is only mildly elevated at 63. We will get a an ultrasound-guided biopsy of her liver lesions to confirm the diagnosis and assess for a primary. There is no obvious etiology on CT scan other than a thickened gastric wall. Patient will return to clinic several days after her biopsy to discuss the results, further diagnostic evaluation, and treatment planning. 2. DCIS: Low-grade. Patient completed 5 years of tamoxifen in October 2015.  Patient expressed understanding and was in agreement with this plan. She also understands that She can call clinic at any time with any questions, concerns, or complaints.    Lloyd Huger, MD   03/20/2015 11:07 AM

## 2015-03-22 ENCOUNTER — Inpatient Hospital Stay: Payer: Medicare Other | Attending: Oncology | Admitting: Oncology

## 2015-03-22 VITALS — BP 173/95 | HR 114 | Temp 99.9°F | Resp 16

## 2015-03-22 DIAGNOSIS — D473 Essential (hemorrhagic) thrombocythemia: Secondary | ICD-10-CM | POA: Diagnosis not present

## 2015-03-22 DIAGNOSIS — R109 Unspecified abdominal pain: Secondary | ICD-10-CM | POA: Diagnosis not present

## 2015-03-22 DIAGNOSIS — C169 Malignant neoplasm of stomach, unspecified: Secondary | ICD-10-CM

## 2015-03-22 DIAGNOSIS — R5383 Other fatigue: Secondary | ICD-10-CM | POA: Insufficient documentation

## 2015-03-22 DIAGNOSIS — Z5111 Encounter for antineoplastic chemotherapy: Secondary | ICD-10-CM | POA: Diagnosis not present

## 2015-03-22 DIAGNOSIS — D72829 Elevated white blood cell count, unspecified: Secondary | ICD-10-CM | POA: Diagnosis not present

## 2015-03-22 DIAGNOSIS — Z853 Personal history of malignant neoplasm of breast: Secondary | ICD-10-CM

## 2015-03-22 DIAGNOSIS — Z9223 Personal history of estrogen therapy: Secondary | ICD-10-CM

## 2015-03-22 DIAGNOSIS — I2510 Atherosclerotic heart disease of native coronary artery without angina pectoris: Secondary | ICD-10-CM | POA: Insufficient documentation

## 2015-03-22 DIAGNOSIS — Z803 Family history of malignant neoplasm of breast: Secondary | ICD-10-CM

## 2015-03-22 DIAGNOSIS — D649 Anemia, unspecified: Secondary | ICD-10-CM | POA: Diagnosis not present

## 2015-03-22 DIAGNOSIS — R12 Heartburn: Secondary | ICD-10-CM | POA: Diagnosis not present

## 2015-03-22 DIAGNOSIS — R531 Weakness: Secondary | ICD-10-CM | POA: Diagnosis not present

## 2015-03-22 DIAGNOSIS — C787 Secondary malignant neoplasm of liver and intrahepatic bile duct: Secondary | ICD-10-CM | POA: Diagnosis not present

## 2015-03-22 DIAGNOSIS — K5900 Constipation, unspecified: Secondary | ICD-10-CM | POA: Insufficient documentation

## 2015-03-22 DIAGNOSIS — Z79899 Other long term (current) drug therapy: Secondary | ICD-10-CM

## 2015-03-22 DIAGNOSIS — R5381 Other malaise: Secondary | ICD-10-CM | POA: Insufficient documentation

## 2015-03-22 DIAGNOSIS — R05 Cough: Secondary | ICD-10-CM | POA: Diagnosis not present

## 2015-03-22 DIAGNOSIS — I10 Essential (primary) hypertension: Secondary | ICD-10-CM

## 2015-03-22 MED ORDER — HYDROCODONE-ACETAMINOPHEN 5-325 MG PO TABS: 1.0000 | ORAL_TABLET | Freq: Four times a day (QID) | ORAL | Status: DC | PRN

## 2015-03-22 MED ORDER — BENZONATATE 100 MG PO CAPS: 100.0000 mg | ORAL_CAPSULE | Freq: Four times a day (QID) | ORAL | Status: AC | PRN

## 2015-03-22 NOTE — Progress Notes (Signed)
Patient has a cough that is relieved with Benzonatate so a refill was sent to pharmacy.  Also has episodes of right side pain and the pain was a 4/10 on pain scale that was relieved with Hydrocodone also needing a refill on pain med.

## 2015-03-23 ENCOUNTER — Telehealth: Payer: Self-pay

## 2015-03-23 NOTE — Telephone Encounter (Signed)
Appointment with Dr. Tamala Julian on 03/28/15 @ 9:45

## 2015-03-25 NOTE — Patient Instructions (Signed)
Cisplatin injection What is this medicine? CISPLATIN (SIS pla tin) is a chemotherapy drug. It targets fast dividing cells, like cancer cells, and causes these cells to die. This medicine is used to treat many types of cancer like bladder, ovarian, and testicular cancers. This medicine may be used for other purposes; ask your health care provider or pharmacist if you have questions. What should I tell my health care provider before I take this medicine? They need to know if you have any of these conditions: -blood disorders -hearing problems -kidney disease -recent or ongoing radiation therapy -an unusual or allergic reaction to cisplatin, carboplatin, other chemotherapy, other medicines, foods, dyes, or preservatives -pregnant or trying to get pregnant -breast-feeding How should I use this medicine? This drug is given as an infusion into a vein. It is administered in a hospital or clinic by a specially trained health care professional. Talk to your pediatrician regarding the use of this medicine in children. Special care may be needed. Overdosage: If you think you have taken too much of this medicine contact a poison control center or emergency room at once. NOTE: This medicine is only for you. Do not share this medicine with others. What if I miss a dose? It is important not to miss a dose. Call your doctor or health care professional if you are unable to keep an appointment. What may interact with this medicine? -dofetilide -foscarnet -medicines for seizures -medicines to increase blood counts like filgrastim, pegfilgrastim, sargramostim -probenecid -pyridoxine used with altretamine -rituximab -some antibiotics like amikacin, gentamicin, neomycin, polymyxin B, streptomycin, tobramycin -sulfinpyrazone -vaccines -zalcitabine Talk to your doctor or health care professional before taking any of these medicines: -acetaminophen -aspirin -ibuprofen -ketoprofen -naproxen This list may  not describe all possible interactions. Give your health care provider a list of all the medicines, herbs, non-prescription drugs, or dietary supplements you use. Also tell them if you smoke, drink alcohol, or use illegal drugs. Some items may interact with your medicine. What should I watch for while using this medicine? Your condition will be monitored carefully while you are receiving this medicine. You will need important blood work done while you are taking this medicine. This drug may make you feel generally unwell. This is not uncommon, as chemotherapy can affect healthy cells as well as cancer cells. Report any side effects. Continue your course of treatment even though you feel ill unless your doctor tells you to stop. In some cases, you may be given additional medicines to help with side effects. Follow all directions for their use. Call your doctor or health care professional for advice if you get a fever, chills or sore throat, or other symptoms of a cold or flu. Do not treat yourself. This drug decreases your body's ability to fight infections. Try to avoid being around people who are sick. This medicine may increase your risk to bruise or bleed. Call your doctor or health care professional if you notice any unusual bleeding. Be careful brushing and flossing your teeth or using a toothpick because you may get an infection or bleed more easily. If you have any dental work done, tell your dentist you are receiving this medicine. Avoid taking products that contain aspirin, acetaminophen, ibuprofen, naproxen, or ketoprofen unless instructed by your doctor. These medicines may hide a fever. Do not become pregnant while taking this medicine. Women should inform their doctor if they wish to become pregnant or think they might be pregnant. There is a potential for serious side effects to   an unborn child. Talk to your health care professional or pharmacist for more information. Do not breast-feed an  infant while taking this medicine. Drink fluids as directed while you are taking this medicine. This will help protect your kidneys. Call your doctor or health care professional if you get diarrhea. Do not treat yourself. What side effects may I notice from receiving this medicine? Side effects that you should report to your doctor or health care professional as soon as possible: -allergic reactions like skin rash, itching or hives, swelling of the face, lips, or tongue -signs of infection - fever or chills, cough, sore throat, pain or difficulty passing urine -signs of decreased platelets or bleeding - bruising, pinpoint red spots on the skin, black, tarry stools, nosebleeds -signs of decreased red blood cells - unusually weak or tired, fainting spells, lightheadedness -breathing problems -changes in hearing -gout pain -low blood counts - This drug may decrease the number of white blood cells, red blood cells and platelets. You may be at increased risk for infections and bleeding. -nausea and vomiting -pain, swelling, redness or irritation at the injection site -pain, tingling, numbness in the hands or feet -problems with balance, movement -trouble passing urine or change in the amount of urine Side effects that usually do not require medical attention (report to your doctor or health care professional if they continue or are bothersome): -changes in vision -loss of appetite -metallic taste in the mouth or changes in taste This list may not describe all possible side effects. Call your doctor for medical advice about side effects. You may report side effects to FDA at 1-800-FDA-1088. Where should I keep my medicine? This drug is given in a hospital or clinic and will not be stored at home. NOTE: This sheet is a summary. It may not cover all possible information. If you have questions about this medicine, talk to your doctor, pharmacist, or health care provider.    2016, [employer]  Standard. (2007-09-02 14:40:54) Fluorouracil, 5-FU injection What is this medicine? FLUOROURACIL, 5-FU (flure oh YOOR a sil) is a chemotherapy drug. It slows the growth of cancer cells. This medicine is used to treat many types of cancer like breast cancer, colon or rectal cancer, pancreatic cancer, and stomach cancer. This medicine may be used for other purposes; ask your health care provider or pharmacist if you have questions. What should I tell my health care provider before I take this medicine? They need to know if you have any of these conditions: -blood disorders -dihydropyrimidine dehydrogenase (DPD) deficiency -infection (especially a virus infection such as chickenpox, cold sores, or herpes) -kidney disease -liver disease -malnourished, poor nutrition -recent or ongoing radiation therapy -an unusual or allergic reaction to fluorouracil, other chemotherapy, other medicines, foods, dyes, or preservatives -pregnant or trying to get pregnant -breast-feeding How should I use this medicine? This drug is given as an infusion or injection into a vein. It is administered in a hospital or clinic by a specially trained health care professional. Talk to your pediatrician regarding the use of this medicine in children. Special care may be needed. Overdosage: If you think you have taken too much of this medicine contact a poison control center or emergency room at once. NOTE: This medicine is only for you. Do not share this medicine with others. What if I miss a dose? It is important not to miss your dose. Call your doctor or health care professional if you are unable to keep an appointment. What may interact with  this medicine? -allopurinol -cimetidine -dapsone -digoxin -hydroxyurea -leucovorin -levamisole -medicines for seizures like ethotoin, fosphenytoin, phenytoin -medicines to increase blood counts like filgrastim, pegfilgrastim, sargramostim -medicines that treat or prevent  blood clots like warfarin, enoxaparin, and dalteparin -methotrexate -metronidazole -pyrimethamine -some other chemotherapy drugs like busulfan, cisplatin, estramustine, vinblastine -trimethoprim -trimetrexate -vaccines Talk to your doctor or health care professional before taking any of these medicines: -acetaminophen -aspirin -ibuprofen -ketoprofen -naproxen This list may not describe all possible interactions. Give your health care provider a list of all the medicines, herbs, non-prescription drugs, or dietary supplements you use. Also tell them if you smoke, drink alcohol, or use illegal drugs. Some items may interact with your medicine. What should I watch for while using this medicine? Visit your doctor for checks on your progress. This drug may make you feel generally unwell. This is not uncommon, as chemotherapy can affect healthy cells as well as cancer cells. Report any side effects. Continue your course of treatment even though you feel ill unless your doctor tells you to stop. In some cases, you may be given additional medicines to help with side effects. Follow all directions for their use. Call your doctor or health care professional for advice if you get a fever, chills or sore throat, or other symptoms of a cold or flu. Do not treat yourself. This drug decreases your body's ability to fight infections. Try to avoid being around people who are sick. This medicine may increase your risk to bruise or bleed. Call your doctor or health care professional if you notice any unusual bleeding. Be careful brushing and flossing your teeth or using a toothpick because you may get an infection or bleed more easily. If you have any dental work done, tell your dentist you are receiving this medicine. Avoid taking products that contain aspirin, acetaminophen, ibuprofen, naproxen, or ketoprofen unless instructed by your doctor. These medicines may hide a fever. Do not become pregnant while taking  this medicine. Women should inform their doctor if they wish to become pregnant or think they might be pregnant. There is a potential for serious side effects to an unborn child. Talk to your health care professional or pharmacist for more information. Do not breast-feed an infant while taking this medicine. Men should inform their doctor if they wish to father a child. This medicine may lower sperm counts. Do not treat diarrhea with over the counter products. Contact your doctor if you have diarrhea that lasts more than 2 days or if it is severe and watery. This medicine can make you more sensitive to the sun. Keep out of the sun. If you cannot avoid being in the sun, wear protective clothing and use sunscreen. Do not use sun lamps or tanning beds/booths. What side effects may I notice from receiving this medicine? Side effects that you should report to your doctor or health care professional as soon as possible: -allergic reactions like skin rash, itching or hives, swelling of the face, lips, or tongue -low blood counts - this medicine may decrease the number of white blood cells, red blood cells and platelets. You may be at increased risk for infections and bleeding. -signs of infection - fever or chills, cough, sore throat, pain or difficulty passing urine -signs of decreased platelets or bleeding - bruising, pinpoint red spots on the skin, black, tarry stools, blood in the urine -signs of decreased red blood cells - unusually weak or tired, fainting spells, lightheadedness -breathing problems -changes in vision -chest pain -mouth sores -  nausea and vomiting -pain, swelling, redness at site where injected -pain, tingling, numbness in the hands or feet -redness, swelling, or sores on hands or feet -stomach pain -unusual bleeding Side effects that usually do not require medical attention (report to your doctor or health care professional if they continue or are bothersome): -changes in finger  or toe nails -diarrhea -dry or itchy skin -hair loss -headache -loss of appetite -sensitivity of eyes to the light -stomach upset -unusually teary eyes This list may not describe all possible side effects. Call your doctor for medical advice about side effects. You may report side effects to FDA at 1-800-FDA-1088. Where should I keep my medicine? This drug is given in a hospital or clinic and will not be stored at home. NOTE: This sheet is a summary. It may not cover all possible information. If you have questions about this medicine, talk to your doctor, pharmacist, or health care provider.    2016, [employer] Standard. (2007-10-01 13:53:16)

## 2015-03-28 ENCOUNTER — Encounter
Admission: RE | Admit: 2015-03-28 | Discharge: 2015-03-28 | Disposition: A | Discharge status: Home / Self Care | Payer: Medicare Other | Source: Ambulatory Visit | Attending: Surgery | Admitting: Surgery

## 2015-03-28 DIAGNOSIS — Z9011 Acquired absence of right breast and nipple: Secondary | ICD-10-CM | POA: Diagnosis not present

## 2015-03-28 DIAGNOSIS — Z79899 Other long term (current) drug therapy: Secondary | ICD-10-CM | POA: Diagnosis not present

## 2015-03-28 DIAGNOSIS — Z8505 Personal history of malignant neoplasm of liver: Secondary | ICD-10-CM | POA: Diagnosis not present

## 2015-03-28 DIAGNOSIS — Z853 Personal history of malignant neoplasm of breast: Secondary | ICD-10-CM | POA: Diagnosis not present

## 2015-03-28 DIAGNOSIS — I2510 Atherosclerotic heart disease of native coronary artery without angina pectoris: Secondary | ICD-10-CM | POA: Diagnosis not present

## 2015-03-28 DIAGNOSIS — I10 Essential (primary) hypertension: Secondary | ICD-10-CM | POA: Diagnosis not present

## 2015-03-28 DIAGNOSIS — Z9861 Coronary angioplasty status: Secondary | ICD-10-CM | POA: Diagnosis not present

## 2015-03-28 DIAGNOSIS — C169 Malignant neoplasm of stomach, unspecified: Secondary | ICD-10-CM | POA: Diagnosis present

## 2015-03-28 DIAGNOSIS — C787 Secondary malignant neoplasm of liver and intrahepatic bile duct: Secondary | ICD-10-CM | POA: Diagnosis not present

## 2015-03-28 DIAGNOSIS — Z9889 Other specified postprocedural states: Secondary | ICD-10-CM | POA: Diagnosis not present

## 2015-03-28 HISTORY — DX: Malignant neoplasm of stomach, unspecified: C16.9

## 2015-03-28 NOTE — Patient Instructions (Signed)
  Your procedure is scheduled on: 03/31/15 Thurs  Report to Day Surgery. 2nd floor [facility] To find out your arrival time please call [phone] between 1PM - 3PM on 03/30/15 Wed Remember: Instructions that are not followed completely may result in serious medical risk, up to and including death, or upon the discretion of your surgeon and anesthesiologist your surgery may need to be rescheduled.    __x__ 1. Do not eat food or drink liquids after midnight. No gum chewing or hard candies.     ____ 2. No Alcohol for 24 hours before or after surgery.   ____ 3. Bring all medications with you on the day of surgery if instructed.    _x___ 4. Notify your doctor if there is any change in your medical condition     (cold, fever, infections).     Do not wear jewelry, make-up, hairpins, clips or nail polish.  Do not wear lotions, powders, or perfumes. You may wear deodorant.  Do not shave 48 hours prior to surgery. Men may shave face and neck.  Do not bring valuables to the hospital.    [facility] is not responsible for any belongings or valuables.               Contacts, dentures or bridgework may not be worn into surgery.  Leave your suitcase in the car. After surgery it may be brought to your room.  For patients admitted to the hospital, discharge time is determined by your                treatment team.   Patients discharged the day of surgery will not be allowed to drive home.   Please read over the following fact sheets that you were given:      ____ Take these medicines the morning of surgery with A SIP OF WATER:    1.benzonatate (TESSALON PERLES) 100 MG capsule  2. ondansetron (ZOFRAN ODT) 4 MG disintegrating tablet  3.   4.  5.  6.  ____ Fleet Enema (as directed)   _x___ Use CHG Soap as directed  ____ Use inhalers on the day of surgery  ____ Stop metformin 2 days prior to surgery    ____ Take 1/2 of usual insulin dose the night before surgery and none on the  morning of surgery.   ____ Stop Coumadin/Plavix/aspirin on   ____ Stop Anti-inflammatories on    ____ Stop supplements until after surgery.    ____ Bring C-Pap to the hospital.

## 2015-03-29 ENCOUNTER — Inpatient Hospital Stay: Payer: Medicare Other

## 2015-03-29 ENCOUNTER — Encounter: Payer: Self-pay | Admitting: Oncology

## 2015-03-29 LAB — SURGICAL PATHOLOGY

## 2015-03-31 ENCOUNTER — Ambulatory Visit: Payer: Medicare Other

## 2015-03-31 ENCOUNTER — Encounter: Payer: Self-pay | Admitting: *Deleted

## 2015-03-31 ENCOUNTER — Ambulatory Visit
Admission: RE | Admit: 2015-03-31 | Discharge: 2015-03-31 | Disposition: A | Discharge status: Home / Self Care | Payer: Medicare Other | Source: Ambulatory Visit | Attending: Surgery | Admitting: Surgery

## 2015-03-31 ENCOUNTER — Ambulatory Visit: Payer: Medicare Other | Admitting: Anesthesiology

## 2015-03-31 ENCOUNTER — Encounter
Admission: RE | Disposition: A | Discharge status: Home / Self Care | Payer: Self-pay | Source: Ambulatory Visit | Attending: Surgery

## 2015-03-31 ENCOUNTER — Other Ambulatory Visit: Payer: Self-pay | Admitting: *Deleted

## 2015-03-31 DIAGNOSIS — Z9011 Acquired absence of right breast and nipple: Secondary | ICD-10-CM | POA: Insufficient documentation

## 2015-03-31 DIAGNOSIS — C169 Malignant neoplasm of stomach, unspecified: Secondary | ICD-10-CM

## 2015-03-31 DIAGNOSIS — C787 Secondary malignant neoplasm of liver and intrahepatic bile duct: Secondary | ICD-10-CM | POA: Insufficient documentation

## 2015-03-31 DIAGNOSIS — Z853 Personal history of malignant neoplasm of breast: Secondary | ICD-10-CM | POA: Insufficient documentation

## 2015-03-31 DIAGNOSIS — Z79899 Other long term (current) drug therapy: Secondary | ICD-10-CM | POA: Insufficient documentation

## 2015-03-31 DIAGNOSIS — Z9889 Other specified postprocedural states: Secondary | ICD-10-CM | POA: Insufficient documentation

## 2015-03-31 DIAGNOSIS — I10 Essential (primary) hypertension: Secondary | ICD-10-CM | POA: Insufficient documentation

## 2015-03-31 DIAGNOSIS — Z8505 Personal history of malignant neoplasm of liver: Secondary | ICD-10-CM | POA: Insufficient documentation

## 2015-03-31 DIAGNOSIS — Z9861 Coronary angioplasty status: Secondary | ICD-10-CM | POA: Insufficient documentation

## 2015-03-31 DIAGNOSIS — I2510 Atherosclerotic heart disease of native coronary artery without angina pectoris: Secondary | ICD-10-CM | POA: Insufficient documentation

## 2015-03-31 HISTORY — PX: PORTACATH PLACEMENT: SHX2246

## 2015-03-31 SURGERY — INSERTION, TUNNELED CENTRAL VENOUS DEVICE, WITH PORT
Anesthesia: Monitor Anesthesia Care | Laterality: Right | Wound class: Clean

## 2015-03-31 MED ORDER — CEFAZOLIN SODIUM 1-5 GM-% IV SOLN
1.0000 g | Freq: Once | INTRAVENOUS | Status: AC
  Administered 2015-03-31: 1 g via INTRAVENOUS

## 2015-03-31 MED ORDER — LIDOCAINE-PRILOCAINE 2.5-2.5 % EX CREA: 1.0000 "application " | TOPICAL_CREAM | CUTANEOUS | Status: AC | PRN

## 2015-03-31 MED ORDER — FAMOTIDINE 20 MG PO TABS
ORAL_TABLET | ORAL | Status: AC
  Administered 2015-03-31: 09:00:00
  Filled 2015-03-31: qty 1

## 2015-03-31 MED ORDER — LACTATED RINGERS IV SOLN
INTRAVENOUS | Status: DC
  Administered 2015-03-31: 09:00:00 via INTRAVENOUS

## 2015-03-31 MED ORDER — CEFAZOLIN SODIUM 1-5 GM-% IV SOLN
INTRAVENOUS | Status: AC
  Filled 2015-03-31: qty 50

## 2015-03-31 MED ORDER — OXYCODONE HCL 5 MG PO TABS: 5.0000 mg | ORAL_TABLET | Freq: Once | ORAL | Status: DC | PRN

## 2015-03-31 MED ORDER — SODIUM CHLORIDE 0.9 % IJ SOLN
INTRAMUSCULAR | Status: AC
  Filled 2015-03-31: qty 50

## 2015-03-31 MED ORDER — HEPARIN SODIUM (PORCINE) 5000 UNIT/ML IJ SOLN
INTRAMUSCULAR | Status: AC
  Filled 2015-03-31: qty 1

## 2015-03-31 MED ORDER — LIDOCAINE HCL (PF) 1 % IJ SOLN
INTRAMUSCULAR | Status: AC
  Filled 2015-03-31: qty 30

## 2015-03-31 MED ORDER — MIDAZOLAM HCL 2 MG/2ML IJ SOLN
INTRAMUSCULAR | Status: DC | PRN
  Administered 2015-03-31: 1 mg via INTRAVENOUS

## 2015-03-31 MED ORDER — LIDOCAINE HCL (PF) 1 % IJ SOLN
INTRAMUSCULAR | Status: DC | PRN
  Administered 2015-03-31: 12 mL via SUBCUTANEOUS

## 2015-03-31 MED ORDER — PROPOFOL 500 MG/50ML IV EMUL
INTRAVENOUS | Status: DC | PRN
  Administered 2015-03-31: 75 ug/kg/min via INTRAVENOUS

## 2015-03-31 MED ORDER — FAMOTIDINE 20 MG PO TABS: 20.0000 mg | ORAL_TABLET | Freq: Once | ORAL | Status: DC

## 2015-03-31 MED ORDER — SODIUM CHLORIDE 0.9 % IV SOLN
INTRAVENOUS | Status: DC | PRN
  Administered 2015-03-31: 15 mL via INTRAMUSCULAR

## 2015-03-31 MED ORDER — FENTANYL CITRATE (PF) 100 MCG/2ML IJ SOLN: 25.0000 ug | INTRAMUSCULAR | Status: DC | PRN

## 2015-03-31 MED ORDER — OXYCODONE HCL 5 MG/5ML PO SOLN: 5.0000 mg | Freq: Once | ORAL | Status: DC | PRN

## 2015-03-31 MED ORDER — FENTANYL CITRATE (PF) 100 MCG/2ML IJ SOLN
INTRAMUSCULAR | Status: DC | PRN
  Administered 2015-03-31: 1 ug via INTRAVENOUS

## 2015-03-31 SURGICAL SUPPLY — 18 items
CANISTER SUCT 1200ML W/VALVE (MISCELLANEOUS) ×3 IMPLANT
CHLORAPREP W/TINT 26ML (MISCELLANEOUS) ×3 IMPLANT
COVER LIGHT HANDLE STERIS (MISCELLANEOUS) ×6 IMPLANT
GLOVE BIO SURGEON STRL SZ7.5 (GLOVE) ×9 IMPLANT
GOWN STRL REUS W/ TWL LRG LVL3 (GOWN DISPOSABLE) ×2 IMPLANT
GOWN STRL REUS W/TWL LRG LVL3 (GOWN DISPOSABLE) ×4
KIT RM TURNOVER STRD PROC AR (KITS) ×3 IMPLANT
LABEL OR SOLS (LABEL) ×3 IMPLANT
LIQUID BAND (GAUZE/BANDAGES/DRESSINGS) ×3 IMPLANT
NEEDLE FILTER BLUNT 18X 1/2SAF (NEEDLE) ×2
NEEDLE FILTER BLUNT 18X1 1/2 (NEEDLE) ×1 IMPLANT
PACK PORT-A-CATH (MISCELLANEOUS) ×3 IMPLANT
PAD GROUND ADULT SPLIT (MISCELLANEOUS) ×3 IMPLANT
PORTACATH POWER 8F (Port) ×3 IMPLANT
SUT SILK 4 0 SH (SUTURE) ×3 IMPLANT
SUT VIC AB 5-0 RB1 27 (SUTURE) ×3 IMPLANT
SYR 3ML LL SCALE MARK (SYRINGE) ×3 IMPLANT
SYRINGE 10CC LL (SYRINGE) ×3 IMPLANT

## 2015-03-31 NOTE — Anesthesia Postprocedure Evaluation (Signed)
  Anesthesia Post-op Note  Patient: Chloe Arnold  Procedure(s) Performed: Procedure(s): INSERTION PORT-A-CATH (Right)  Anesthesia type:General, General LMA, MAC  Patient location: PACU  Post pain: Pain level controlled  Post assessment: Post-op Vital signs reviewed, Patient's Cardiovascular Status Stable, Respiratory Function Stable, Patent Airway and No signs of Nausea or vomiting  Post vital signs: Reviewed and stable  Last Vitals:  Filed Vitals:   03/31/15 1200  BP: 143/73  Pulse: 90  Temp:   Resp: 16    Level of consciousness: awake, alert  and patient cooperative  Complications: No apparent anesthesia complications

## 2015-03-31 NOTE — Op Note (Signed)
OPERATIVE REPORT  PREOPERATIVE  DIAGNOSIS: . Stomach cancer  POSTOPERATIVE DIAGNOSIS: . Stomach cancer  PROCEDURE: . Insertion central venous catheter with subcutaneous infusion port  ANESTHESIA:  General  SURGEON: Rochel Brome  MD    INDICATIONS: . 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   With the patient on the operating table in the supine position a rolled sheet was placed behind the shoulder blades to extend the neck. She was monitored by the anesthesia staff and sedated. The neck and chest wall were prepared with ChloraPrep and draped in a sterile manner.  The skin beneath the right clavicle was infiltrated with 1% Xylocaine. A transversely oriented 3 cm incision was made and carried down through subcutaneous tissues. Several small bleeding points were cauterized. A subcutaneous pouch was created large enough to admit the ClearView port. The jugular vein was identified with ultrasound. The skin overlying the jugular vein was infiltrated with 1% Xylocaine. A transversely oriented 5 mm incision was made and carried down through subcutaneous tissues. A needle was inserted into the jugular vein using ultrasound guidance. An image was saved for the paper chart. A guidewire was advanced down through the needle into the central circulation. Fluoroscopy was used to demonstrate the location of the guidewire in the vena cava. The dilator and introducer sheath were advanced over the guidewire. The guidewire and dilator were removed. The catheter was placed down through the sheath and the sheath was peeled away. Fluoroscopy was used to demonstrate the tip of the catheter in the superior vena cava. An image was saved for the paper chart. The catheter was tunneled to the subclavian incision and pressure held over the tunnel site. The catheter was cut to fit and attached to the ClearView port and accessed with a Huber needle  aspirating a trace of blood and flushing with 5 cc of heparinized saline solution. The port was placed into the subcutaneous pouch and sutured to the deep fascia with 4-0 silk. Hemostasis was intact. Subcutaneous tissues were approximated with 5-0 Vicryl. Both skin incisions were closed with 5-0 Vicryl subcuticular suture and LiquiBand. The patient tolerated surgery satisfactorily and was prepared for transfer to the recovery room.  Rochel Brome M.D.

## 2015-03-31 NOTE — H&P (Signed)
  She reports no change in condition since office visit  Discussed plan for port

## 2015-03-31 NOTE — Anesthesia Preprocedure Evaluation (Signed)
Anesthesia Evaluation  Patient identified by MRN, date of birth, ID band Patient awake    Reviewed: Allergy & Precautions, H&P , NPO status , Patient's Chart, lab work & pertinent test results  History of Anesthesia Complications Negative for: history of anesthetic complications  Airway Mallampati: III  TM Distance: >3 FB Neck ROM: limited    Dental  (+) Poor Dentition, Chipped, Missing   Pulmonary neg pulmonary ROS, neg shortness of breath,    Pulmonary exam normal breath sounds clear to auscultation       Cardiovascular Exercise Tolerance: Good hypertension, (-) angina+ CAD  (-) Past MI and (-) DOE Normal cardiovascular exam Rhythm:regular Rate:Normal     Neuro/Psych negative neurological ROS  negative psych ROS   GI/Hepatic negative GI ROS, Neg liver ROS,   Endo/Other  negative endocrine ROS  Renal/GU negative Renal ROS  negative genitourinary   Musculoskeletal   Abdominal   Peds  Hematology negative hematology ROS (+)   Anesthesia Other Findings Past Medical History:   Hypertension                                                 Coronary artery disease                                        Comment:angioplasty   Indigestion                                                  Nausea                                                       Coughing                                                     Cancer (HCC)                                                   Comment:breast   Cancer of stomach (HCC)                                      Shortness of breath dyspnea                                 Past Surgical History:   BREAST SURGERY  ABDOMINAL HYSTERECTOMY                                        goiter removed                                  Bilateral              JOINT REPLACEMENT                                             KNEE ARTHROPLASTY                                Left                Reproductive/Obstetrics negative OB ROS                             Anesthesia Physical Anesthesia Plan  ASA: III  Anesthesia Plan: General, General LMA and MAC   Post-op Pain Management:    Induction:   Airway Management Planned:   Additional Equipment:   Intra-op Plan:   Post-operative Plan:   Informed Consent: I have reviewed the patients History and Physical, chart, labs and discussed the procedure including the risks, benefits and alternatives for the proposed anesthesia with the patient or authorized representative who has indicated his/her understanding and acceptance.   Dental Advisory Given  Plan Discussed with: Anesthesiologist, CRNA and Surgeon  Anesthesia Plan Comments:         Anesthesia Quick Evaluation

## 2015-03-31 NOTE — Discharge Instructions (Addendum)
Take Tylenol or hydrocodone as needed for pain.  May shower.  Gradually resume usual activities as tolerated.  [facility]  DISCHARGE INSTRUCTIONS   1) The drugs that you were given will stay in your system until tomorrow so for the next 24 hours you should not:  A) Drive an automobile B) Make any legal decisions C) Drink any alcoholic beverage   2) You may resume regular meals tomorrow.  Today it is better to start with liquids and gradually work up to solid foods.  You may eat anything you prefer, but it is better to start with liquids, then soup and crackers, and gradually work up to solid foods.   3) Please notify your doctor immediately if you have any unusual bleeding, trouble breathing, redness and pain at the surgery site, drainage, fever, or pain not relieved by medication.    4) Additional Instructions:        Please contact your physician with any problems or Same Day Surgery at [phone], Monday through Friday 6 am to 4 pm, or [place] at [facility] number at [phone].

## 2015-03-31 NOTE — OR Nursing (Signed)
Dr. Smith into see pt.

## 2015-03-31 NOTE — Transfer of Care (Signed)
Immediate Anesthesia Transfer of Care Note  Patient: Chloe Arnold  Procedure(s) Performed: Procedure(s): INSERTION PORT-A-CATH (Right)  Patient Location: PACU  Anesthesia Type:General  Level of Consciousness: sedated  Airway & Oxygen Therapy: Patient Spontanous Breathing and Patient connected to nasal cannula oxygen  Post-op Assessment: Report given to RN and Post -op Vital signs reviewed and stable  Post vital signs: Reviewed and stable  Last Vitals:  Filed Vitals:   03/31/15 1025  BP: 120/70  Pulse: 86  Temp: 36.2 C  Resp: 12    Complications: No apparent anesthesia complications

## 2015-04-01 DIAGNOSIS — C169 Malignant neoplasm of stomach, unspecified: Secondary | ICD-10-CM | POA: Insufficient documentation

## 2015-04-01 MED ORDER — PROCHLORPERAZINE MALEATE 10 MG PO TABS: 10.0000 mg | ORAL_TABLET | Freq: Four times a day (QID) | ORAL | Status: DC | PRN

## 2015-04-01 MED ORDER — ONDANSETRON HCL 8 MG PO TABS
8.0000 mg | ORAL_TABLET | Freq: Two times a day (BID) | ORAL | Status: DC | PRN
Start: 2015-04-01 — End: 2015-05-23

## 2015-04-04 ENCOUNTER — Inpatient Hospital Stay (HOSPITAL_BASED_OUTPATIENT_CLINIC_OR_DEPARTMENT_OTHER): Payer: Medicare Other | Admitting: Oncology

## 2015-04-04 ENCOUNTER — Inpatient Hospital Stay: Payer: Medicare Other

## 2015-04-04 VITALS — BP 143/83 | HR 108 | Temp 97.4°F | Resp 18 | Wt 173.1 lb

## 2015-04-04 DIAGNOSIS — R05 Cough: Secondary | ICD-10-CM

## 2015-04-04 DIAGNOSIS — Z79899 Other long term (current) drug therapy: Secondary | ICD-10-CM

## 2015-04-04 DIAGNOSIS — C787 Secondary malignant neoplasm of liver and intrahepatic bile duct: Secondary | ICD-10-CM | POA: Diagnosis not present

## 2015-04-04 DIAGNOSIS — Z853 Personal history of malignant neoplasm of breast: Secondary | ICD-10-CM | POA: Diagnosis not present

## 2015-04-04 DIAGNOSIS — C169 Malignant neoplasm of stomach, unspecified: Secondary | ICD-10-CM

## 2015-04-04 DIAGNOSIS — Z9223 Personal history of estrogen therapy: Secondary | ICD-10-CM | POA: Diagnosis not present

## 2015-04-04 DIAGNOSIS — D649 Anemia, unspecified: Secondary | ICD-10-CM

## 2015-04-04 DIAGNOSIS — D72829 Elevated white blood cell count, unspecified: Secondary | ICD-10-CM

## 2015-04-04 DIAGNOSIS — D473 Essential (hemorrhagic) thrombocythemia: Secondary | ICD-10-CM

## 2015-04-04 DIAGNOSIS — Z803 Family history of malignant neoplasm of breast: Secondary | ICD-10-CM

## 2015-04-04 DIAGNOSIS — I2510 Atherosclerotic heart disease of native coronary artery without angina pectoris: Secondary | ICD-10-CM

## 2015-04-04 DIAGNOSIS — I10 Essential (primary) hypertension: Secondary | ICD-10-CM

## 2015-04-04 LAB — CBC WITH DIFFERENTIAL/PLATELET
BAND NEUTROPHILS: 2 %
BASOS PCT: 0 %
Basophils Absolute: 0 10*3/uL (ref 0–0.1)
Eosinophils Absolute: 0 10*3/uL (ref 0–0.7)
Eosinophils Relative: 0 %
HEMATOCRIT: 28.2 % — AB (ref 35.0–47.0)
Hemoglobin: 8.9 g/dL — ABNORMAL LOW (ref 12.0–16.0)
LYMPHS PCT: 7 %
Lymphs Abs: 3 10*3/uL (ref 1.0–3.6)
MCH: 26.1 pg (ref 26.0–34.0)
MCHC: 31.5 g/dL — ABNORMAL LOW (ref 32.0–36.0)
MCV: 83 fL (ref 80.0–100.0)
MONOS PCT: 11 %
Monocytes Absolute: 4.8 10*3/uL — ABNORMAL HIGH (ref 0.2–0.9)
NEUTROS PCT: 80 %
Neutro Abs: 35.7 10*3/uL — ABNORMAL HIGH (ref 1.4–6.5)
Platelets: 479 10*3/uL — ABNORMAL HIGH (ref 150–440)
RBC: 3.39 MIL/uL — ABNORMAL LOW (ref 3.80–5.20)
RDW: 22.1 % — ABNORMAL HIGH (ref 11.5–14.5)
Smear Review: ADEQUATE
WBC: 43.5 10*3/uL — ABNORMAL HIGH (ref 3.6–11.0)

## 2015-04-04 LAB — COMPREHENSIVE METABOLIC PANEL
ALT: 18 U/L (ref 14–54)
AST: 32 U/L (ref 15–41)
Albumin: 2.2 g/dL — ABNORMAL LOW (ref 3.5–5.0)
Alkaline Phosphatase: 363 U/L — ABNORMAL HIGH (ref 38–126)
Anion gap: 7 (ref 5–15)
BILIRUBIN TOTAL: 0.8 mg/dL (ref 0.3–1.2)
BUN: 10 mg/dL (ref 6–20)
CO2: 27 mmol/L (ref 22–32)
CREATININE: 0.45 mg/dL (ref 0.44–1.00)
Calcium: 8 mg/dL — ABNORMAL LOW (ref 8.9–10.3)
Chloride: 103 mmol/L (ref 101–111)
GFR calc Af Amer: >60 mL/min (ref >60)
Glucose, Bld: 114 mg/dL — ABNORMAL HIGH (ref 65–99)
Potassium: 3.6 mmol/L (ref 3.5–5.1)
Sodium: 137 mmol/L (ref 135–145)
TOTAL PROTEIN: 7.1 g/dL (ref 6.5–8.1)

## 2015-04-04 MED ORDER — CISPLATIN CHEMO INJECTION 100MG/100ML
75.0000 mg/m2 | Freq: Once | INTRAVENOUS | Status: AC
  Administered 2015-04-04: 134 mg via INTRAVENOUS
  Filled 2015-04-04: qty 134

## 2015-04-04 MED ORDER — SODIUM CHLORIDE 0.9 % IV SOLN
Freq: Once | INTRAVENOUS | Status: AC
  Administered 2015-04-04: 10:00:00 via INTRAVENOUS
  Filled 2015-04-04: qty 1000

## 2015-04-04 MED ORDER — MANNITOL 25 % IV SOLN
Freq: Once | INTRAVENOUS | Status: AC
  Administered 2015-04-04: 10:00:00 via INTRAVENOUS
  Filled 2015-04-04: qty 1000

## 2015-04-04 MED ORDER — PALONOSETRON HCL INJECTION 0.25 MG/5ML
0.2500 mg | Freq: Once | INTRAVENOUS | Status: AC
  Administered 2015-04-04: 0.25 mg via INTRAVENOUS
  Filled 2015-04-04: qty 5

## 2015-04-04 MED ORDER — SODIUM CHLORIDE 0.9 % IJ SOLN
10.0000 mL | INTRAMUSCULAR | Status: DC | PRN
  Administered 2015-04-04: 10 mL
  Filled 2015-04-04: qty 10

## 2015-04-04 MED ORDER — SODIUM CHLORIDE 0.9 % IV SOLN
800.0000 mg/m2/d | INTRAVENOUS | Status: DC
  Administered 2015-04-04: 5700 mg via INTRAVENOUS
  Filled 2015-04-04: qty 20

## 2015-04-04 MED ORDER — HEPARIN SOD (PORK) LOCK FLUSH 100 UNIT/ML IV SOLN
500.0000 [IU] | Freq: Once | INTRAVENOUS | Status: DC | PRN
  Filled 2015-04-04: qty 5

## 2015-04-04 MED ORDER — FOSAPREPITANT DIMEGLUMINE INJECTION 150 MG
Freq: Once | INTRAVENOUS | Status: AC
  Administered 2015-04-04: 13:00:00 via INTRAVENOUS
  Filled 2015-04-04: qty 5

## 2015-04-04 NOTE — Progress Notes (Signed)
Patient here today for ongoing follow up and treatment consideration regarding gastric cancer, patient here today for first chemotherapy treatment with Cisplatin and 5FU.

## 2015-04-05 NOTE — Progress Notes (Signed)
[place]  [phone]) [phone] [phone]  ID: Chloe Arnold OB: [birth year]  MR#: [id number]  DZH#:[id number]  Patient Care Team: No Pcp Per Patient as PCP - General (General Practice) Clent Jacks, RN as Registered Nurse Leonie Green, MD as Referring Physician (Surgery)  CHIEF COMPLAINT:  Chief Complaint  Patient presents with  . Results    INTERVAL HISTORY: Patient  Returns to clinic today for further evaluation and discussion of her pathology results. She has a chronic cough that is unchanged. She continues to have right sided flank and abdominal pain is helped with hydrocodone. She otherwise feels well. She has no neurologic complaints. She has a good appetite and denies weight loss. She denies any other pain. She denies any fevers. She has no chest pain or shortness of breath. She denies any nausea, vomiting, constipation, or diarrhea. She has no melanoma or hematochezia. She has no urinary complaints. Patient offers no further specific complaints.  REVIEW OF SYSTEMS:   Review of Systems  Constitutional: Negative for fever, weight loss and malaise/fatigue.  Respiratory: Positive for cough.   Cardiovascular: Negative.   Gastrointestinal: Positive for abdominal pain. Negative for nausea, vomiting, diarrhea, constipation, blood in stool and melena.  Genitourinary: Negative.   Musculoskeletal: Negative.   Neurological: Negative for weakness.    As per HPI. Otherwise, a complete review of systems is negatve.  PAST MEDICAL HISTORY: Past Medical History  Diagnosis Date  . Hypertension   . Coronary artery disease     angioplasty  . Indigestion   . Nausea   . Coughing   . Cancer ([place])     breast  . Cancer of stomach ([place])   . Shortness of breath dyspnea     PAST SURGICAL HISTORY: Past Surgical History  Procedure Laterality Date  . Breast surgery    . Abdominal hysterectomy    . Goiter removed Bilateral   . Joint replacement     . Knee arthroplasty Left   . Portacath placement Right 03/31/2015    Procedure: INSERTION PORT-A-CATH;  Surgeon: Leonie Green, MD;  Location: [facility] ORS;  Service: General;  Laterality: Right;    FAMILY HISTORY: Sister with breast cancer.  Also, diabetes and hypertension.     ADVANCED DIRECTIVES:    HEALTH MAINTENANCE: Social History  Substance Use Topics  . Smoking status: Never Smoker   . Smokeless tobacco: Not on file  . Alcohol Use: No     Colonoscopy:  PAP:  Bone density:  Lipid panel:  No Known Allergies  Current Outpatient Prescriptions  Medication Sig Dispense Refill  . acetaminophen (TYLENOL) 325 MG tablet Take 650 mg by mouth every 6 (six) hours as needed for moderate pain, fever or headache.    . benzonatate (TESSALON PERLES) 100 MG capsule Take 1 capsule (100 mg total) by mouth every 6 (six) hours as needed for cough. 30 capsule 1  . HYDROcodone-acetaminophen (NORCO/VICODIN) 5-325 MG tablet Take 1 tablet by mouth every 6 (six) hours as needed for moderate pain. 30 tablet 0  . ondansetron (ZOFRAN ODT) 4 MG disintegrating tablet Take 1 tablet (4 mg total) by mouth every 8 (eight) hours as needed for nausea or vomiting. 20 tablet 0  . lidocaine-prilocaine (EMLA) cream Apply 1 application topically as needed. Apply to port 1-2 hours prior to chemotherapy appointment. Cover with plastic wrap. 30 g 2  . ondansetron (ZOFRAN) 8 MG tablet Take 1 tablet (8 mg total) by mouth 2 (two) times daily as needed.  Start on the third day after chemotherapy. 30 tablet 1  . prochlorperazine (COMPAZINE) 10 MG tablet Take 1 tablet (10 mg total) by mouth every 6 (six) hours as needed (Nausea or vomiting). 30 tablet 1   No current facility-administered medications for this visit.    OBJECTIVE: Filed Vitals:   03/22/15 1420  BP: 173/95  Pulse: 114  Temp: 99.9 F (37.7 C)  Resp: 16     There is no weight on file to calculate BMI.    ECOG FS:0 - Asymptomatic  General:  Well-developed, well-nourished, no acute distress. Eyes: Pink conjunctiva, anicteric sclera. Lungs: Clear to auscultation bilaterally. Heart: Regular rate and rhythm. No rubs, murmurs, or gallops. Abdomen: Soft, nontender, nondistended. No organomegaly noted, normoactive bowel sounds. Musculoskeletal: No edema, cyanosis, or clubbing. Neuro: Alert, answering all questions appropriately. Cranial nerves grossly intact. Skin: No rashes or petechiae noted. Psych: Normal affect.   LAB RESULTS:  Lab Results  Component Value Date   NA 137 04/04/2015   K 3.6 04/04/2015   CL 103 04/04/2015   CO2 27 04/04/2015   GLUCOSE 114* 04/04/2015   BUN 10 04/04/2015   CREATININE 0.45 04/04/2015   CALCIUM 8.0* 04/04/2015   PROT 7.1 04/04/2015   ALBUMIN 2.2* 04/04/2015   AST 32 04/04/2015   ALT 18 04/04/2015   ALKPHOS 363* 04/04/2015   BILITOT 0.8 04/04/2015   GFRNONAA >60 04/04/2015   GFRAA >60 04/04/2015    Lab Results  Component Value Date   WBC 43.5* 04/04/2015   NEUTROABS 35.7* 04/04/2015   HGB 8.9* 04/04/2015   HCT 28.2* 04/04/2015   MCV 83.0 04/04/2015   PLT 479* 04/04/2015     STUDIES: [place] Core Biopsy  03/17/2015  CLINICAL DATA:  69 year old with multiple liver lesions. History of right breast cancer. Tissue diagnosis is needed. EXAM: ULTRASOUND GUIDED LIVER LESION BIOPSY Physician: Stephan Minister. Anselm Pancoast, MD FLUOROSCOPY TIME:  None MEDICATIONS: 2 mg versed, 50 mcg fentanyl. A radiology nurse monitored the patient for moderate sedation. ANESTHESIA/SEDATION: Moderate sedation time:  15 minutes PROCEDURE: The procedure was explained to the patient. The risks and benefits of the procedure were discussed and the patient's questions were addressed. Informed consent was obtained from the patient. Patient was placed supine on the ultrasound examination table. The liver was evaluated with ultrasound. A lesion in the left hepatic lobe was targeted for biopsy. The anterior abdomen was prepped with  chlorhexidine and sterile field was created. The skin and soft tissues were anesthetized with 1% lidocaine. A 17 gauge needle was directed into the anterior left hepatic lesion with ultrasound guidance. Three core biopsies were obtained with an 18 gauge core device. Specimens placed in formalin. Bandage placed over the puncture site. FINDINGS: There are innumerable hypoechoic and heterogeneous lesions throughout the liver. A round hypoechoic lesion in the left hepatic lobe was targeted. Needle position confirmed within the lesion on all occasions. No significant bleeding following the core biopsies. Estimated blood loss: Minimal COMPLICATIONS: None IMPRESSION: Ultrasound-guided core biopsies of left hepatic lesion. Electronically Signed   By: Markus Daft M.D.   On: 03/17/2015 10:47   Dg Chest Port 1v Same Day  03/31/2015  CLINICAL DATA:  New central line placement, history of gastric neoplasm EXAM: PORTABLE CHEST 1 VIEW COMPARISON:  Portable chest x-ray of 03/06/2015 FINDINGS: A right-sided Port-A-Cath is now present with the tip overlying the mid lower SVC. No pneumothorax is seen. The lungs appear clear. No pleural effusion is noted. Heart size is stable. IMPRESSION: New right-sided  Port-A-Cath present with tip overlying the lower SVC. No pneumothorax. Electronically Signed   By: Ivar Drape M.D.   On: 03/31/2015 11:37   Dg C-arm 1-60 Min-no Report  03/31/2015  CLINICAL DATA: port a cath insertion C-ARM 1-60 MINUTES Fluoroscopy was utilized by the requesting physician.  No radiographic interpretation.    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  PLAN:    1.  Gastric cancer: biopsy results of liver consistent with gastric primary. HER-2 is negative. After lengthy discussion with patient and her daughter, she wishes to pursue palliative chemotherapy. Patient will return to clinic in approximately 2 weeks to initiate cycle 1 of cisplatin and 5-FU pump. This will be a 28  day cycle. Will likely reimage after 3-4 cycles. Patient will require port placement prior to her treatment.  2. Pain: Patient was given a refill for Hydrocodone today.  3. Cough: Continue Tessalon Perles as prescribed.  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  Patient expressed understanding and was in agreement with this plan. She also understands that She can call clinic at any time with any questions, concerns, or complaints.    Lloyd Huger, MD   04/05/2015 11:15 PM

## 2015-04-05 NOTE — Progress Notes (Signed)
[place]  [phone]) [phone] [phone]  ID: Chloe Arnold OB: [birth year]  MR#: [id number]  CBU#:[id number]  Patient Care Team: No Pcp Per Patient as PCP - General (General Practice) Clent Jacks, RN as Registered Nurse Leonie Green, MD as Referring Physician (Surgery)  CHIEF COMPLAINT:  Chief Complaint  Patient presents with  . Gastric Cancer    follow up  . Chemotherapy    new Cisplatin; 5FU    INTERVAL HISTORY: Patient returns to clinic today for further evaluation and  Consideration of cycle 1 of cisplatin and 5-FU pump. Her pain is better controlled. She currently feels well and is asymptomatic. She has no neurologic complaints. She has a good appetite and denies weight loss. She denies any fevers. She has no chest pain or shortness of breath. She denies any nausea, vomiting, constipation, or diarrhea. She has no melanoma or hematochezia. She has no urinary complaints. Patient offers no specific complaints today. REVIEW OF SYSTEMS:   Review of Systems  Constitutional: Negative for fever, weight loss and malaise/fatigue.  Respiratory: Positive for cough.   Cardiovascular: Negative.   Gastrointestinal: Negative.  Negative for nausea, vomiting, abdominal pain, diarrhea, constipation, blood in stool and melena.  Genitourinary: Negative.   Musculoskeletal: Negative.   Neurological: Negative for weakness.    As per HPI. Otherwise, a complete review of systems is negatve.  PAST MEDICAL HISTORY: Past Medical History  Diagnosis Date  . Hypertension   . Coronary artery disease     angioplasty  . Indigestion   . Nausea   . Coughing   . Cancer ([place])     breast  . Cancer of stomach ([place])   . Shortness of breath dyspnea     PAST SURGICAL HISTORY: Past Surgical History  Procedure Laterality Date  . Breast surgery    . Abdominal hysterectomy    . Goiter removed Bilateral   . Joint replacement    . Knee arthroplasty Left   .  Portacath placement Right 03/31/2015    Procedure: INSERTION PORT-A-CATH;  Surgeon: Leonie Green, MD;  Location: [facility] ORS;  Service: General;  Laterality: Right;    FAMILY HISTORY: Sister with breast cancer.  Also, diabetes and hypertension.     ADVANCED DIRECTIVES:    HEALTH MAINTENANCE: Social History  Substance Use Topics  . Smoking status: Never Smoker   . Smokeless tobacco: Not on file  . Alcohol Use: No     Colonoscopy:  PAP:  Bone density:  Lipid panel:  No Known Allergies  Current Outpatient Prescriptions  Medication Sig Dispense Refill  . acetaminophen (TYLENOL) 325 MG tablet Take 650 mg by mouth every 6 (six) hours as needed for moderate pain, fever or headache.    . benzonatate (TESSALON PERLES) 100 MG capsule Take 1 capsule (100 mg total) by mouth every 6 (six) hours as needed for cough. 30 capsule 1  . HYDROcodone-acetaminophen (NORCO/VICODIN) 5-325 MG tablet Take 1 tablet by mouth every 6 (six) hours as needed for moderate pain. 30 tablet 0  . lidocaine-prilocaine (EMLA) cream Apply 1 application topically as needed. Apply to port 1-2 hours prior to chemotherapy appointment. Cover with plastic wrap. 30 g 2  . ondansetron (ZOFRAN ODT) 4 MG disintegrating tablet Take 1 tablet (4 mg total) by mouth every 8 (eight) hours as needed for nausea or vomiting. 20 tablet 0  . ondansetron (ZOFRAN) 8 MG tablet Take 1 tablet (8 mg total) by mouth 2 (two) times daily as needed. Start  on the third day after chemotherapy. 30 tablet 1  . prochlorperazine (COMPAZINE) 10 MG tablet Take 1 tablet (10 mg total) by mouth every 6 (six) hours as needed (Nausea or vomiting). 30 tablet 1   No current facility-administered medications for this visit.    OBJECTIVE: Filed Vitals:   04/04/15 0959  BP: 143/83  Pulse: 108  Temp: 97.4 F (36.3 C)  Resp: 18     Body mass index is 31.65 kg/(m^2).    ECOG FS:0 - Asymptomatic  General: Well-developed, well-nourished, no acute  distress. Eyes: Pink conjunctiva, anicteric sclera. Lungs: Clear to auscultation bilaterally. Heart: Regular rate and rhythm. No rubs, murmurs, or gallops. Abdomen: Soft, nontender, nondistended. No organomegaly noted, normoactive bowel sounds. Musculoskeletal: No edema, cyanosis, or clubbing. Neuro: Alert, answering all questions appropriately. Cranial nerves grossly intact. Skin: No rashes or petechiae noted. Psych: Normal affect.   LAB RESULTS:  Lab Results  Component Value Date   NA 137 04/04/2015   K 3.6 04/04/2015   CL 103 04/04/2015   CO2 27 04/04/2015   GLUCOSE 114* 04/04/2015   BUN 10 04/04/2015   CREATININE 0.45 04/04/2015   CALCIUM 8.0* 04/04/2015   PROT 7.1 04/04/2015   ALBUMIN 2.2* 04/04/2015   AST 32 04/04/2015   ALT 18 04/04/2015   ALKPHOS 363* 04/04/2015   BILITOT 0.8 04/04/2015   GFRNONAA >60 04/04/2015   GFRAA >60 04/04/2015    Lab Results  Component Value Date   WBC 43.5* 04/04/2015   NEUTROABS 35.7* 04/04/2015   HGB 8.9* 04/04/2015   HCT 28.2* 04/04/2015   MCV 83.0 04/04/2015   PLT 479* 04/04/2015     STUDIES: [place] Core Biopsy  03/17/2015  CLINICAL DATA:  69 year old with multiple liver lesions. History of right breast cancer. Tissue diagnosis is needed. EXAM: ULTRASOUND GUIDED LIVER LESION BIOPSY Physician: Stephan Minister. Anselm Pancoast, MD FLUOROSCOPY TIME:  None MEDICATIONS: 2 mg versed, 50 mcg fentanyl. A radiology nurse monitored the patient for moderate sedation. ANESTHESIA/SEDATION: Moderate sedation time:  15 minutes PROCEDURE: The procedure was explained to the patient. The risks and benefits of the procedure were discussed and the patient's questions were addressed. Informed consent was obtained from the patient. Patient was placed supine on the ultrasound examination table. The liver was evaluated with ultrasound. A lesion in the left hepatic lobe was targeted for biopsy. The anterior abdomen was prepped with chlorhexidine and sterile field was created. The  skin and soft tissues were anesthetized with 1% lidocaine. A 17 gauge needle was directed into the anterior left hepatic lesion with ultrasound guidance. Three core biopsies were obtained with an 18 gauge core device. Specimens placed in formalin. Bandage placed over the puncture site. FINDINGS: There are innumerable hypoechoic and heterogeneous lesions throughout the liver. A round hypoechoic lesion in the left hepatic lobe was targeted. Needle position confirmed within the lesion on all occasions. No significant bleeding following the core biopsies. Estimated blood loss: Minimal COMPLICATIONS: None IMPRESSION: Ultrasound-guided core biopsies of left hepatic lesion. Electronically Signed   By: Markus Daft M.D.   On: 03/17/2015 10:47   Dg Chest Port 1v Same Day  03/31/2015  CLINICAL DATA:  New central line placement, history of gastric neoplasm EXAM: PORTABLE CHEST 1 VIEW COMPARISON:  Portable chest x-ray of 03/06/2015 FINDINGS: A right-sided Port-A-Cath is now present with the tip overlying the mid lower SVC. No pneumothorax is seen. The lungs appear clear. No pleural effusion is noted. Heart size is stable. IMPRESSION: New right-sided Port-A-Cath present with tip  overlying the lower SVC. No pneumothorax. Electronically Signed   By: Ivar Drape M.D.   On: 03/31/2015 11:37   Dg C-arm 1-60 Min-no Report  03/31/2015  CLINICAL DATA: port a cath insertion C-ARM 1-60 MINUTES Fluoroscopy was utilized by the requesting physician.  No radiographic interpretation.    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  PLAN:    1.  Gastric cancer: Biopsy results of liver consistent with gastric primary. HER-2 is negative.  Proceed with cycle 1 above cisplatin 75 mg/m and 5-FU pump 800 mg/m per day 5 days. This will be a 28 day cycle. Return to clinic on Friday for pump removal and then in 1 week for laboratory work and further evaluation. Will likely reimage after 3-4 cycles.  2.  Pain:  Continue hydrocodone as needed 3. Cough: Continue Tessalon Perles as prescribed.   4. Leukocytosis: Likely reactive, monitor.  5. Thrombocytosis: Likely reactive, monitor.  6. Anemia: Will get iron stores in the near future, monitor. 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  Patient expressed understanding and was in agreement with this plan. She also understands that She can call clinic at any time with any questions, concerns, or complaints.    Lloyd Huger, MD   04/05/2015 11:19 PM

## 2015-04-08 ENCOUNTER — Inpatient Hospital Stay: Payer: Medicare Other

## 2015-04-08 VITALS — BP 127/75 | HR 102 | Temp 97.8°F | Resp 18

## 2015-04-08 DIAGNOSIS — C169 Malignant neoplasm of stomach, unspecified: Secondary | ICD-10-CM

## 2015-04-08 MED ORDER — HEPARIN SOD (PORK) LOCK FLUSH 100 UNIT/ML IV SOLN
500.0000 [IU] | Freq: Once | INTRAVENOUS | Status: AC | PRN
  Administered 2015-04-08: 500 [IU]
  Filled 2015-04-08: qty 5

## 2015-04-08 MED ORDER — SODIUM CHLORIDE 0.9 % IJ SOLN
10.0000 mL | INTRAMUSCULAR | Status: DC | PRN
  Administered 2015-04-08: 10 mL
  Filled 2015-04-08: qty 10

## 2015-04-11 ENCOUNTER — Inpatient Hospital Stay (HOSPITAL_BASED_OUTPATIENT_CLINIC_OR_DEPARTMENT_OTHER): Payer: Medicare Other | Admitting: Oncology

## 2015-04-11 ENCOUNTER — Inpatient Hospital Stay: Payer: Medicare Other

## 2015-04-11 VITALS — BP 123/83 | HR 118 | Temp 97.1°F | Resp 18 | Wt 166.2 lb

## 2015-04-11 DIAGNOSIS — R12 Heartburn: Secondary | ICD-10-CM

## 2015-04-11 DIAGNOSIS — R531 Weakness: Secondary | ICD-10-CM

## 2015-04-11 DIAGNOSIS — K5900 Constipation, unspecified: Secondary | ICD-10-CM

## 2015-04-11 DIAGNOSIS — Z9223 Personal history of estrogen therapy: Secondary | ICD-10-CM | POA: Diagnosis not present

## 2015-04-11 DIAGNOSIS — D473 Essential (hemorrhagic) thrombocythemia: Secondary | ICD-10-CM

## 2015-04-11 DIAGNOSIS — C169 Malignant neoplasm of stomach, unspecified: Secondary | ICD-10-CM

## 2015-04-11 DIAGNOSIS — R05 Cough: Secondary | ICD-10-CM

## 2015-04-11 DIAGNOSIS — R5383 Other fatigue: Secondary | ICD-10-CM

## 2015-04-11 DIAGNOSIS — C787 Secondary malignant neoplasm of liver and intrahepatic bile duct: Secondary | ICD-10-CM | POA: Diagnosis not present

## 2015-04-11 DIAGNOSIS — D72829 Elevated white blood cell count, unspecified: Secondary | ICD-10-CM

## 2015-04-11 DIAGNOSIS — R109 Unspecified abdominal pain: Secondary | ICD-10-CM

## 2015-04-11 DIAGNOSIS — Z853 Personal history of malignant neoplasm of breast: Secondary | ICD-10-CM | POA: Diagnosis not present

## 2015-04-11 DIAGNOSIS — D649 Anemia, unspecified: Secondary | ICD-10-CM

## 2015-04-11 LAB — COMPREHENSIVE METABOLIC PANEL
ALBUMIN: 2.6 g/dL — AB (ref 3.5–5.0)
ALT: 15 U/L (ref 14–54)
ANION GAP: 7 (ref 5–15)
AST: 22 U/L (ref 15–41)
Alkaline Phosphatase: 253 U/L — ABNORMAL HIGH (ref 38–126)
BUN: 10 mg/dL (ref 6–20)
CALCIUM: 7.9 mg/dL — AB (ref 8.9–10.3)
CHLORIDE: 99 mmol/L — AB (ref 101–111)
CO2: 30 mmol/L (ref 22–32)
Creatinine, Ser: 0.48 mg/dL (ref 0.44–1.00)
GFR calc non Af Amer: >60 mL/min (ref >60)
GLUCOSE: 174 mg/dL — AB (ref 65–99)
Potassium: 3.4 mmol/L — ABNORMAL LOW (ref 3.5–5.1)
SODIUM: 136 mmol/L (ref 135–145)
Total Bilirubin: 0.5 mg/dL (ref 0.3–1.2)
Total Protein: 7.1 g/dL (ref 6.5–8.1)

## 2015-04-11 LAB — CBC WITH DIFFERENTIAL/PLATELET
BAND NEUTROPHILS: 1 %
BASOS ABS: 0 10*3/uL (ref 0–0.1)
BASOS PCT: 0 %
Blasts: 0 %
EOS ABS: 0 10*3/uL (ref 0–0.7)
Eosinophils Relative: 0 %
HCT: 25.8 % — ABNORMAL LOW (ref 35.0–47.0)
HEMOGLOBIN: 7.9 g/dL — AB (ref 12.0–16.0)
Lymphocytes Relative: 6 %
Lymphs Abs: 1.3 10*3/uL (ref 1.0–3.6)
MCH: 25.9 pg — ABNORMAL LOW (ref 26.0–34.0)
MCHC: 30.8 g/dL — AB (ref 32.0–36.0)
MCV: 84.2 fL (ref 80.0–100.0)
METAMYELOCYTES PCT: 0 %
MONO ABS: 1.8 10*3/uL — AB (ref 0.2–0.9)
Monocytes Relative: 8 %
Myelocytes: 0 %
Neutro Abs: 18.9 10*3/uL — ABNORMAL HIGH (ref 1.4–6.5)
Neutrophils Relative %: 85 %
Other: 0 %
PLATELETS: 360 10*3/uL (ref 150–440)
PROMYELOCYTES ABS: 0 %
RBC: 3.06 MIL/uL — ABNORMAL LOW (ref 3.80–5.20)
RDW: 21.6 % — ABNORMAL HIGH (ref 11.5–14.5)
SMEAR REVIEW: ADEQUATE
WBC: 22 10*3/uL — ABNORMAL HIGH (ref 3.6–11.0)
nRBC: 0 /100 WBC

## 2015-04-11 MED ORDER — PANTOPRAZOLE SODIUM 20 MG PO TBEC: 20.0000 mg | DELAYED_RELEASE_TABLET | Freq: Every day | ORAL | Status: AC

## 2015-04-11 MED ORDER — HYDROCODONE-ACETAMINOPHEN 5-325 MG PO TABS: 1.0000 | ORAL_TABLET | Freq: Four times a day (QID) | ORAL | Status: DC | PRN

## 2015-04-11 NOTE — Progress Notes (Signed)
[place]  Telephone:(336) [phone] [phone]  ID: Chloe Arnold OB: [birth year]  MR#: [id number]  KGM#:[id number]  Patient Care Team: No Pcp Per Patient as PCP - General (General Practice) Clent Jacks, RN as Registered Nurse Leonie Green, MD as Referring Physician (Surgery)  CHIEF COMPLAINT:  Chief Complaint  Patient presents with  . Follow-up    INTERVAL HISTORY: Patient returns to clinic today for further evaluation and to assess her toleration of cycle 1 of cisplatin and 5-FU pump. Her pain is better controlled. She has increased reflux symptoms. She also admits to mild increased weakness and fatigue. She has no neurologic complaints. She has a good appetite and denies weight loss. She denies any fevers. She has no chest pain or shortness of breath. She denies any nausea, vomiting, constipation, or diarrhea. She has no melanoma or hematochezia. She has no urinary complaints. Patient offers no further specific complaints today.   REVIEW OF SYSTEMS:   Review of Systems  Constitutional: Positive for malaise/fatigue. Negative for fever and weight loss.  Respiratory: Positive for cough.   Cardiovascular: Negative.   Gastrointestinal: Positive for heartburn and constipation. Negative for nausea, vomiting, abdominal pain, diarrhea, blood in stool and melena.  Genitourinary: Negative.   Musculoskeletal: Negative.   Neurological: Positive for weakness.    As per HPI. Otherwise, a complete review of systems is negatve.  PAST MEDICAL HISTORY: Past Medical History  Diagnosis Date  . Hypertension   . Coronary artery disease     angioplasty  . Indigestion   . Nausea   . Coughing   . Cancer ([place])     breast  . Cancer of stomach ([place])   . Shortness of breath dyspnea     PAST SURGICAL HISTORY: Past Surgical History  Procedure Laterality Date  . Breast surgery    . Abdominal hysterectomy    . Goiter removed Bilateral   . Joint  replacement    . Knee arthroplasty Left   . Portacath placement Right 03/31/2015    Procedure: INSERTION PORT-A-CATH;  Surgeon: Leonie Green, MD;  Location: [facility] ORS;  Service: General;  Laterality: Right;    FAMILY HISTORY: Sister with breast cancer.  Also, diabetes and hypertension.     ADVANCED DIRECTIVES:    HEALTH MAINTENANCE: Social History  Substance Use Topics  . Smoking status: Never Smoker   . Smokeless tobacco: Not on file  . Alcohol Use: No     Colonoscopy:  PAP:  Bone density:  Lipid panel:  No Known Allergies  Current Outpatient Prescriptions  Medication Sig Dispense Refill  . acetaminophen (TYLENOL) 325 MG tablet Take 650 mg by mouth every 6 (six) hours as needed for moderate pain, fever or headache.    . benzonatate (TESSALON PERLES) 100 MG capsule Take 1 capsule (100 mg total) by mouth every 6 (six) hours as needed for cough. 30 capsule 1  . HYDROcodone-acetaminophen (NORCO/VICODIN) 5-325 MG tablet Take 1 tablet by mouth every 6 (six) hours as needed for moderate pain. 30 tablet 0  . lidocaine-prilocaine (EMLA) cream Apply 1 application topically as needed. Apply to port 1-2 hours prior to chemotherapy appointment. Cover with plastic wrap. 30 g 2  . ondansetron (ZOFRAN) 8 MG tablet Take 1 tablet (8 mg total) by mouth 2 (two) times daily as needed. Start on the third day after chemotherapy. 30 tablet 1  . prochlorperazine (COMPAZINE) 10 MG tablet Take 1 tablet (10 mg total) by mouth every 6 (six) hours as  needed (Nausea or vomiting). 30 tablet 1  . pantoprazole (PROTONIX) 20 MG tablet Take 1 tablet (20 mg total) by mouth daily. 30 tablet 5   No current facility-administered medications for this visit.    OBJECTIVE: Filed Vitals:   04/11/15 1506  BP: 123/83  Pulse: 118  Temp: 97.1 F (36.2 C)  Resp: 18     Body mass index is 30.4 kg/(m^2).    ECOG FS:0 - Asymptomatic  General: Well-developed, well-nourished, no acute distress. Eyes: Pink  conjunctiva, anicteric sclera. Lungs: Clear to auscultation bilaterally. Heart: Regular rate and rhythm. No rubs, murmurs, or gallops. Abdomen: Soft, nontender, nondistended. No organomegaly noted, normoactive bowel sounds. Musculoskeletal: No edema, cyanosis, or clubbing. Neuro: Alert, answering all questions appropriately. Cranial nerves grossly intact. Skin: No rashes or petechiae noted. Psych: Normal affect.   LAB RESULTS:  Lab Results  Component Value Date   NA 136 04/11/2015   K 3.4* 04/11/2015   CL 99* 04/11/2015   CO2 30 04/11/2015   GLUCOSE 174* 04/11/2015   BUN 10 04/11/2015   CREATININE 0.48 04/11/2015   CALCIUM 7.9* 04/11/2015   PROT 7.1 04/11/2015   ALBUMIN 2.6* 04/11/2015   AST 22 04/11/2015   ALT 15 04/11/2015   ALKPHOS 253* 04/11/2015   BILITOT 0.5 04/11/2015   GFRNONAA >60 04/11/2015   GFRAA >60 04/11/2015    Lab Results  Component Value Date   WBC 22.0* 04/11/2015   NEUTROABS 18.9* 04/11/2015   HGB 7.9* 04/11/2015   HCT 25.8* 04/11/2015   MCV 84.2 04/11/2015   PLT 360 04/11/2015     STUDIES: Dg Chest Port 1v Same Day  03/31/2015  CLINICAL DATA:  New central line placement, history of gastric neoplasm EXAM: PORTABLE CHEST 1 VIEW COMPARISON:  Portable chest x-ray of 03/06/2015 FINDINGS: A right-sided Port-A-Cath is now present with the tip overlying the mid lower SVC. No pneumothorax is seen. The lungs appear clear. No pleural effusion is noted. Heart size is stable. IMPRESSION: New right-sided Port-A-Cath present with tip overlying the lower SVC. No pneumothorax. Electronically Signed   By: Ivar Drape M.D.   On: 03/31/2015 11:37   Dg C-arm 1-60 Min-no Report  03/31/2015  CLINICAL DATA: port a cath insertion C-ARM 1-60 MINUTES Fluoroscopy was utilized by the requesting physician.  No radiographic interpretation.    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  PLAN:    1.  Gastric cancer: Biopsy results of liver  consistent with gastric primary. HER-2 is negative.  Patient tolerated cycle 1 with cisplatin 75 mg/m and 5-FU pump 800 mg/m per day 5 days without significant side effects. This will be a 28 day cycle. Return to clinic in 3 weeks for laboratory work and consideration of cycle 2. Will likely reimage after 3-4 cycles.  2. Pain:  Improved. Continue hydrocodone as needed 3. Cough: Continue Tessalon Perles as prescribed.   4. Leukocytosis: Likely reactive, monitor.  5. Thrombocytosis: Resolved. 6. Anemia: Will get iron stores in the near future, monitor. 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  Patient expressed understanding and was in agreement with this plan. She also understands that She can call clinic at any time with any questions, concerns, or complaints.    Lloyd Huger, MD   04/17/2015 10:34 AM

## 2015-04-11 NOTE — Progress Notes (Signed)
New Bilateral leg edema.  Having constipation and is going to try Miralax.  Also having acid reflux that makes her feel like she is going to vomit.

## 2015-04-28 ENCOUNTER — Other Ambulatory Visit: Payer: Self-pay | Admitting: *Deleted

## 2015-04-28 ENCOUNTER — Other Ambulatory Visit: Payer: Self-pay | Admitting: Oncology

## 2015-04-28 DIAGNOSIS — C169 Malignant neoplasm of stomach, unspecified: Secondary | ICD-10-CM

## 2015-04-28 MED ORDER — HYDROCODONE-ACETAMINOPHEN 10-325 MG PO TABS: 1.0000 | ORAL_TABLET | Freq: Four times a day (QID) | ORAL | Status: AC | PRN

## 2015-04-28 NOTE — Telephone Encounter (Signed)
Prescription printed.  Patient's daughter can pick it up this afternoon.

## 2015-04-28 NOTE — Telephone Encounter (Signed)
Called to report increased pain in right side times 4 days. She had been taking her Norco 1 tab every 6 hours without relief so 2 days ago she doubled up on it and is taking 2 tabs every 6 hours. She is out of med today and her pain is worse. Do you want to change her med? Norco 10/325?

## 2015-04-28 NOTE — Telephone Encounter (Signed)
Patient will have her daughter to come by, but does not get off work until 42

## 2015-05-06 ENCOUNTER — Encounter: Payer: Self-pay | Admitting: *Deleted

## 2015-05-06 ENCOUNTER — Observation Stay
Admission: EM | Admit: 2015-05-06 | Discharge: 2015-05-07 | Disposition: A | Discharge status: Home / Self Care | Payer: Medicare Other | Attending: Internal Medicine | Admitting: Internal Medicine

## 2015-05-06 ENCOUNTER — Emergency Department: Payer: Medicare Other

## 2015-05-06 DIAGNOSIS — R000 Tachycardia, unspecified: Secondary | ICD-10-CM | POA: Insufficient documentation

## 2015-05-06 DIAGNOSIS — R51 Headache: Secondary | ICD-10-CM | POA: Insufficient documentation

## 2015-05-06 DIAGNOSIS — Z9861 Coronary angioplasty status: Secondary | ICD-10-CM | POA: Insufficient documentation

## 2015-05-06 DIAGNOSIS — R42 Dizziness and giddiness: Secondary | ICD-10-CM | POA: Diagnosis not present

## 2015-05-06 DIAGNOSIS — J9811 Atelectasis: Secondary | ICD-10-CM | POA: Insufficient documentation

## 2015-05-06 DIAGNOSIS — R55 Syncope and collapse: Secondary | ICD-10-CM | POA: Diagnosis not present

## 2015-05-06 DIAGNOSIS — D72829 Elevated white blood cell count, unspecified: Secondary | ICD-10-CM | POA: Insufficient documentation

## 2015-05-06 DIAGNOSIS — C169 Malignant neoplasm of stomach, unspecified: Secondary | ICD-10-CM | POA: Insufficient documentation

## 2015-05-06 DIAGNOSIS — K219 Gastro-esophageal reflux disease without esophagitis: Secondary | ICD-10-CM | POA: Diagnosis not present

## 2015-05-06 DIAGNOSIS — I2510 Atherosclerotic heart disease of native coronary artery without angina pectoris: Secondary | ICD-10-CM | POA: Diagnosis not present

## 2015-05-06 DIAGNOSIS — I119 Hypertensive heart disease without heart failure: Secondary | ICD-10-CM | POA: Diagnosis not present

## 2015-05-06 DIAGNOSIS — J90 Pleural effusion, not elsewhere classified: Secondary | ICD-10-CM | POA: Diagnosis not present

## 2015-05-06 DIAGNOSIS — R079 Chest pain, unspecified: Secondary | ICD-10-CM | POA: Diagnosis not present

## 2015-05-06 DIAGNOSIS — C787 Secondary malignant neoplasm of liver and intrahepatic bile duct: Secondary | ICD-10-CM | POA: Insufficient documentation

## 2015-05-06 DIAGNOSIS — R918 Other nonspecific abnormal finding of lung field: Secondary | ICD-10-CM | POA: Insufficient documentation

## 2015-05-06 DIAGNOSIS — Z79891 Long term (current) use of opiate analgesic: Secondary | ICD-10-CM | POA: Diagnosis not present

## 2015-05-06 DIAGNOSIS — R778 Other specified abnormalities of plasma proteins: Secondary | ICD-10-CM

## 2015-05-06 DIAGNOSIS — R7989 Other specified abnormal findings of blood chemistry: Secondary | ICD-10-CM | POA: Insufficient documentation

## 2015-05-06 DIAGNOSIS — Z853 Personal history of malignant neoplasm of breast: Secondary | ICD-10-CM | POA: Insufficient documentation

## 2015-05-06 LAB — URINALYSIS COMPLETE WITH MICROSCOPIC (ARMC ONLY)
BILIRUBIN URINE: NEGATIVE
Glucose, UA: NEGATIVE mg/dL
Hgb urine dipstick: NEGATIVE
KETONES UR: NEGATIVE mg/dL
NITRITE: NEGATIVE
PH: 5 (ref 5.0–8.0)
PROTEIN: 30 mg/dL — AB
SPECIFIC GRAVITY, URINE: 1.043 — AB (ref 1.005–1.030)
Squamous Epithelial / LPF: NONE SEEN

## 2015-05-06 LAB — CBC WITH DIFFERENTIAL/PLATELET
BASOS ABS: 0 10*3/uL (ref 0–0.1)
BASOS PCT: 0 %
EOS ABS: 0 10*3/uL (ref 0–0.7)
Eosinophils Relative: 0 %
HCT: 29.3 % — ABNORMAL LOW (ref 35.0–47.0)
HEMOGLOBIN: 8.9 g/dL — AB (ref 12.0–16.0)
LYMPHS PCT: 4 %
Lymphs Abs: 1.9 10*3/uL (ref 1.0–3.6)
MCH: 27.5 pg (ref 26.0–34.0)
MCHC: 30.3 g/dL — AB (ref 32.0–36.0)
MCV: 90.9 fL (ref 80.0–100.0)
MONO ABS: 2.9 10*3/uL — AB (ref 0.2–0.9)
Monocytes Relative: 6 %
NEUTROS ABS: 43.6 10*3/uL — AB (ref 1.4–6.5)
Neutrophils Relative %: 90 %
PLATELETS: 319 10*3/uL (ref 150–440)
RBC: 3.22 MIL/uL — ABNORMAL LOW (ref 3.80–5.20)
RDW: 24 % — AB (ref 11.5–14.5)
WBC: 48.4 10*3/uL — ABNORMAL HIGH (ref 3.6–11.0)

## 2015-05-06 LAB — COMPREHENSIVE METABOLIC PANEL
ALBUMIN: 2.1 g/dL — AB (ref 3.5–5.0)
ALT: 36 U/L (ref 14–54)
ANION GAP: 12 (ref 5–15)
AST: 64 U/L — ABNORMAL HIGH (ref 15–41)
Alkaline Phosphatase: 701 U/L — ABNORMAL HIGH (ref 38–126)
BUN: 17 mg/dL (ref 6–20)
CALCIUM: 8.6 mg/dL — AB (ref 8.9–10.3)
CHLORIDE: 97 mmol/L — AB (ref 101–111)
CO2: 26 mmol/L (ref 22–32)
Creatinine, Ser: 0.49 mg/dL (ref 0.44–1.00)
GFR calc non Af Amer: >60 mL/min (ref >60)
GLUCOSE: 112 mg/dL — AB (ref 65–99)
POTASSIUM: 4.5 mmol/L (ref 3.5–5.1)
SODIUM: 135 mmol/L (ref 135–145)
Total Bilirubin: 1.1 mg/dL (ref 0.3–1.2)
Total Protein: 6.4 g/dL — ABNORMAL LOW (ref 6.5–8.1)

## 2015-05-06 LAB — TROPONIN I
TROPONIN I: 0.04 ng/mL — AB (ref <0.031)
TROPONIN I: 0.04 ng/mL — AB (ref <0.031)
Troponin I: 0.36 ng/mL — ABNORMAL HIGH (ref <0.031)

## 2015-05-06 MED ORDER — ONDANSETRON HCL 4 MG PO TABS: 8.0000 mg | ORAL_TABLET | Freq: Two times a day (BID) | ORAL | Status: DC | PRN

## 2015-05-06 MED ORDER — ONDANSETRON 4 MG PO TBDP
4.0000 mg | ORAL_TABLET | Freq: Three times a day (TID) | ORAL | Status: DC | PRN
  Filled 2015-05-06: qty 1

## 2015-05-06 MED ORDER — ASPIRIN 81 MG PO CHEW
324.0000 mg | CHEWABLE_TABLET | Freq: Once | ORAL | Status: AC
  Administered 2015-05-06: 324 mg via ORAL

## 2015-05-06 MED ORDER — PANTOPRAZOLE SODIUM 40 MG PO TBEC
40.0000 mg | DELAYED_RELEASE_TABLET | Freq: Every day | ORAL | Status: DC
  Administered 2015-05-06 – 2015-05-07 (×2): 40 mg via ORAL
  Filled 2015-05-06: qty 2
  Filled 2015-05-06 (×2): qty 1

## 2015-05-06 MED ORDER — ASPIRIN EC 81 MG PO TBEC: 81.0000 mg | DELAYED_RELEASE_TABLET | Freq: Every day | ORAL | Status: DC

## 2015-05-06 MED ORDER — ACETAMINOPHEN 325 MG PO TABS
650.0000 mg | ORAL_TABLET | Freq: Four times a day (QID) | ORAL | Status: DC | PRN
  Administered 2015-05-06 (×2): 650 mg via ORAL
  Filled 2015-05-06 (×2): qty 2

## 2015-05-06 MED ORDER — ASPIRIN EC 81 MG PO TBEC
81.0000 mg | DELAYED_RELEASE_TABLET | Freq: Every day | ORAL | Status: DC
  Administered 2015-05-07: 81 mg via ORAL
  Filled 2015-05-06: qty 1

## 2015-05-06 MED ORDER — BENZONATATE 100 MG PO CAPS
100.0000 mg | ORAL_CAPSULE | Freq: Four times a day (QID) | ORAL | Status: DC | PRN
Start: 2015-05-06 — End: 2015-05-07

## 2015-05-06 MED ORDER — SODIUM CHLORIDE 0.9 % IJ SOLN: 3.0000 mL | INTRAMUSCULAR | Status: DC | PRN

## 2015-05-06 MED ORDER — ACETAMINOPHEN 650 MG RE SUPP: 650.0000 mg | Freq: Four times a day (QID) | RECTAL | Status: DC | PRN

## 2015-05-06 MED ORDER — ACETAMINOPHEN 325 MG PO TABS: 650.0000 mg | ORAL_TABLET | Freq: Four times a day (QID) | ORAL | Status: DC | PRN

## 2015-05-06 MED ORDER — ONDANSETRON HCL 4 MG PO TABS: 4.0000 mg | ORAL_TABLET | Freq: Four times a day (QID) | ORAL | Status: DC | PRN

## 2015-05-06 MED ORDER — ENOXAPARIN SODIUM 40 MG/0.4ML ~~LOC~~ SOLN
40.0000 mg | SUBCUTANEOUS | Status: DC
  Administered 2015-05-06 – 2015-05-07 (×2): 40 mg via SUBCUTANEOUS
  Filled 2015-05-06 (×2): qty 0.4

## 2015-05-06 MED ORDER — PROCHLORPERAZINE MALEATE 10 MG PO TABS
10.0000 mg | ORAL_TABLET | Freq: Four times a day (QID) | ORAL | Status: DC | PRN
  Filled 2015-05-06: qty 1

## 2015-05-06 MED ORDER — SODIUM CHLORIDE 0.9 % IV BOLUS (SEPSIS): 1000.0000 mL | Freq: Once | INTRAVENOUS | Status: DC

## 2015-05-06 MED ORDER — ONDANSETRON HCL 4 MG/2ML IJ SOLN
4.0000 mg | Freq: Four times a day (QID) | INTRAMUSCULAR | Status: DC | PRN
  Administered 2015-05-07: 4 mg via INTRAVENOUS
  Filled 2015-05-06: qty 2

## 2015-05-06 MED ORDER — SODIUM CHLORIDE 0.9 % IV BOLUS (SEPSIS)
500.0000 mL | Freq: Once | INTRAVENOUS | Status: AC
  Administered 2015-05-06: 500 mL via INTRAVENOUS

## 2015-05-06 MED ORDER — IOHEXOL 350 MG/ML SOLN
75.0000 mL | Freq: Once | INTRAVENOUS | Status: AC | PRN
  Administered 2015-05-06: 75 mL via INTRAVENOUS

## 2015-05-06 MED ORDER — HYDROCODONE-ACETAMINOPHEN 10-325 MG PO TABS
1.0000 | ORAL_TABLET | Freq: Four times a day (QID) | ORAL | Status: DC | PRN
  Administered 2015-05-06 – 2015-05-07 (×2): 1 via ORAL
  Filled 2015-05-06 (×2): qty 1

## 2015-05-06 MED ORDER — SODIUM CHLORIDE 0.9 % IJ SOLN
3.0000 mL | Freq: Two times a day (BID) | INTRAMUSCULAR | Status: DC
  Administered 2015-05-06 – 2015-05-07 (×3): 3 mL via INTRAVENOUS

## 2015-05-06 MED ORDER — ASPIRIN 81 MG PO CHEW
CHEWABLE_TABLET | ORAL | Status: AC
  Filled 2015-05-06: qty 4

## 2015-05-06 MED ORDER — SODIUM CHLORIDE 0.9 % IV SOLN: 250.0000 mL | INTRAVENOUS | Status: DC | PRN

## 2015-05-06 NOTE — H&P (Signed)
[facility] [place] NAME: Chloe Arnold    MR#:  [id number]  DATE OF BIRTH:  [birth year]  DATE OF ADMISSION:  05/06/2015  PRIMARY CARE PHYSICIAN: No PCP Per Patient   REQUESTING/REFERRING PHYSICIAN: Dr. Archie Balboa  CHIEF COMPLAINT:  Near syncope dizziness  HISTORY OF PRESENT ILLNESS:  Chloe Arnold  is a 69 y.o. female with a known history of recurrent stomach cancer undergoing chemotherapy, hypertension, CAD with angioplasty in early 2000 comes to the emergency room after he she had a dizzy spell and felt near syncopal. Came to the emergency room remained medically stable her first troponin was 0.036. Given her cardiac history she was asked for further evaluation of management. Patient denied chest pain and shortness of breath. She feels better.  PAST MEDICAL HISTORY:   Past Medical History  Diagnosis Date  . Hypertension   . Coronary artery disease     angioplasty  . Indigestion   . Nausea   . Coughing   . Cancer ([place])     breast  . Cancer of stomach ([place])   . Shortness of breath dyspnea     PAST SURGICAL HISTOIRY:   Past Surgical History  Procedure Laterality Date  . Breast surgery    . Abdominal hysterectomy    . Goiter removed Bilateral   . Joint replacement    . Knee arthroplasty Left   . Portacath placement Right 03/31/2015    Procedure: INSERTION PORT-A-CATH;  Surgeon: Leonie Green, MD;  Location: [facility] ORS;  Service: General;  Laterality: Right;    SOCIAL HISTORY:   Social History  Substance Use Topics  . Smoking status: Never Smoker   . Smokeless tobacco: Not on file  . Alcohol Use: No    FAMILY HISTORY:  No family history on file.  DRUG ALLERGIES:  No Known Allergies  REVIEW OF SYSTEMS:  Review of Systems  Constitutional: Negative for fever, chills and weight loss.  HENT: Negative for ear discharge, ear pain and nosebleeds.   Eyes: Negative for blurred vision, pain and discharge.   Respiratory: Negative for sputum production, shortness of breath, wheezing and stridor.   Cardiovascular: Negative for chest pain, palpitations, orthopnea and PND.  Gastrointestinal: Negative for nausea, vomiting, abdominal pain and diarrhea.  Genitourinary: Negative for urgency and frequency.  Musculoskeletal: Negative for back pain and joint pain.  Neurological: Positive for dizziness and weakness. Negative for sensory change, speech change and focal weakness.  Psychiatric/Behavioral: Negative for depression and hallucinations. The patient is not nervous/anxious.   All other systems reviewed and are negative.    MEDICATIONS AT HOME:   Prior to Admission medications   Medication Sig Start Date End Date Taking? Authorizing Provider  acetaminophen (TYLENOL) 325 MG tablet Take 650 mg by mouth every 6 (six) hours as needed for moderate pain, fever or headache.   Yes Historical Provider, MD  benzonatate (TESSALON PERLES) 100 MG capsule Take 1 capsule (100 mg total) by mouth every 6 (six) hours as needed for cough. 03/22/15 03/21/16 Yes Lloyd Huger, MD  HYDROcodone-acetaminophen (NORCO) 10-325 MG tablet Take 1 tablet by mouth every 6 (six) hours as needed for moderate pain. 04/28/15  Yes Lloyd Huger, MD  lidocaine-prilocaine (EMLA) cream Apply 1 application topically as needed. Apply to port 1-2 hours prior to chemotherapy appointment. Cover with plastic wrap. 03/31/15  Yes Lloyd Huger, MD  ondansetron (ZOFRAN) 8 MG tablet Take 1 tablet (8 mg total) by mouth 2 (two)  times daily as needed. Start on the third day after chemotherapy. 04/01/15  Yes Lloyd Huger, MD  ondansetron (ZOFRAN-ODT) 4 MG disintegrating tablet Take 4 mg by mouth every 8 (eight) hours as needed for nausea or vomiting.   Yes Historical Provider, MD  pantoprazole (PROTONIX) 20 MG tablet Take 1 tablet (20 mg total) by mouth daily. 04/11/15  Yes Lloyd Huger, MD  prochlorperazine (COMPAZINE) 10 MG  tablet Take 1 tablet (10 mg total) by mouth every 6 (six) hours as needed (Nausea or vomiting). 04/01/15  Yes Lloyd Huger, MD      VITAL SIGNS:  Blood pressure 155/70, pulse 101, temperature 97.7 F (36.5 C), temperature source Oral, resp. rate 18, height 5\' 2"  (1.575 m), weight 74.844 kg (165 lb), SpO2 96 %.  PHYSICAL EXAMINATION:  GENERAL:  69 y.o.-year-old patient lying in the bed with no acute distress.  EYES: Pupils equal, round, reactive to light and accommodation. No scleral icterus. Extraocular muscles intact.  HEENT: Head atraumatic, normocephalic. Oropharynx and nasopharynx clear.  NECK:  Supple, no jugular venous distention. No thyroid enlargement, no tenderness.  LUNGS: Normal breath sounds bilaterally, no wheezing, rales,rhonchi or crepitation. No use of accessory muscles of respiration.  CARDIOVASCULAR: S1, S2 normal. No murmurs, rubs, or gallops.  ABDOMEN: Soft, nontender, nondistended. Bowel sounds present. No organomegaly or mass.  EXTREMITIES: No pedal edema, cyanosis, or clubbing.  NEUROLOGIC: Cranial nerves II through XII are intact. Muscle strength 5/5 in all extremities. Sensation intact. Gait not checked.  PSYCHIATRIC: The patient is alert and oriented x 3.  SKIN: No obvious rash, lesion, or ulcer.   LABORATORY PANEL:   CBC  Recent Labs Lab 05/06/15 0645  WBC 48.4*  HGB 8.9*  HCT 29.3*  PLT 319   ------------------------------------------------------------------------------------------------------------------  Chemistries   Recent Labs Lab 05/06/15 0645  NA 135  K 4.5  CL 97*  CO2 26  GLUCOSE 112*  BUN 17  CREATININE 0.49  CALCIUM 8.6*  AST 64*  ALT 36  ALKPHOS 701*  BILITOT 1.1   ------------------------------------------------------------------------------------------------------------------  Cardiac Enzymes  Recent Labs Lab 05/06/15 1140  TROPONINI 0.04*    ------------------------------------------------------------------------------------------------------------------  RADIOLOGY:  Ct Head Wo Contrast  05/06/2015  CLINICAL DATA:  69 year old passed out a few days ago and hit the left side of the head. Headache. EXAM: CT HEAD WITHOUT CONTRAST TECHNIQUE: Contiguous axial images were obtained from the base of the skull through the vertex without contrast. COMPARISON:  None FINDINGS: No evidence for acute hemorrhage, mass lesion, midline shift, hydrocephalus or large infarct. Visualized paranasal sinuses and mastoid air cells are clear. No evidence for a calvarial fracture. IMPRESSION: No acute intracranial abnormality. Electronically Signed   By: Markus Daft M.D.   On: 05/06/2015 08:40   Ct Angio Chest Pe W/cm &/or Wo Cm  05/06/2015  CLINICAL DATA:  Tachycardia in dizzy spells. Right-sided chest pain. Currently being treated for metastatic cancer. EXAM: CT ANGIOGRAPHY CHEST WITH CONTRAST TECHNIQUE: Multidetector CT imaging of the chest was performed using the standard protocol during bolus administration of intravenous contrast. Multiplanar CT image reconstructions and MIPs were obtained to evaluate the vascular anatomy. CONTRAST:  9mL OMNIPAQUE IOHEXOL 350 MG/ML SOLN COMPARISON:  Chest radiography 03/31/2015.  CT abdomen 03/06/2015. FINDINGS: Pulmonary arterial opacification is excellent. There are no pulmonary emboli. There is atherosclerosis of the aorta and of the coronary arteries but no evidence of acute dissection. There are no enlarged hilar or mediastinal lymph nodes. There is a moderate size pleural effusion on  the right layering dependently with pulmonary atelectasis dependently and moderate volume loss in the right lower lobe. There are no pulmonary masses or nodules. Innumerable metastatic lesions are again seen throughout the liver are as was demonstrated on 03/06/2015. Individual lesions that were visible on the previous study appear smaller.  For instance, a 3.4 cm lesion previously seen in the left lobe now measures approximately 2.4 cm in diameter. However, the number of the lesions throughout the liver appear more numerous. This was not a dedicated abdominal study. No destructive bone lesions are seen. Review of the MIP images confirms the above findings. IMPRESSION: No pulmonary emboli. Moderate sized right pleural effusion layering dependently with associated volume loss in the right lung. Innumerable metastatic lesions throughout the liver. Individual lesions seen in September of this year appear smaller, but there appear to be more numerous lesions. The study is not a complete or formal abdominal evaluation. Electronically Signed   By: Nelson Chimes M.D.   On: 05/06/2015 09:01    EKG:   No acute ST elevation or depression IMPRESSION AND PLAN:   Chloe Arnold  is a 69 y.o. female with a known history of recurrent stomach cancer undergoing chemotherapy, hypertension, CAD with angioplasty in early 2000 comes to the emergency room after he she had a dizzy spell and felt near syncopal.  1. Elevated troponin without chest pain or shortness of breath in the setting of history of coronary artery disease with near syncopal episode today. Admit patient to telemetry floor Cardiac enzymes 3 Aspirin 81 mg daily Cardiac consultation if needed. Monitor blood pressure  2. Recurrent cancer of the stomach undergoing chemotherapy at the cancer center  3. GERD continue Protonix  4. Coronary artery disease status post angioplasty in the remote past We'll start patient on baby aspirin.  5. DVT prophylaxis subcutaneous Lovenox  All the records are reviewed and case discussed with ED provider. Management plans discussed with the patient, family and they are in agreement.  CODE STATUS: Full  TOTAL TIME TAKING CARE OF THIS PATIENT: 45 minutes.    Sanjiv Castorena M.D on 05/06/2015 at 2:09 PM  Between 7am to 6pm - Pager - [phone]  After  6pm go to [email or web address] - password [email or web address] [facility]  [facility]  Office  [phone]  CC: Primary care physician; No PCP Per Patient

## 2015-05-06 NOTE — ED Provider Notes (Signed)
[facility] Emergency Department Provider Note    ____________________________________________  Time seen: 0705  I have reviewed the triage vital signs and the nursing notes.   HISTORY  Chief Complaint Fatigue   History limited by: Not Limited   HPI Chloe Arnold is a 69 y.o. female with history of coronary artery disease, stomach cancer currently undergoing chemotherapy, who presents to the emergency department today because of concerns for lightheadedness and near syncope. The patient states the symptoms started roughly 4-1/2 hours ago. She was sleeping in her recliner when she started feeling lightheaded. She states she also started feeling dizzy. She stated she felt like the room was spinning around her. She denied any associated chest pain or shortness of breath. Denied any palpitations. She states she has been having these symptoms on and off for the past week. She passed out one week ago and suffered a hematoma to her left forehead. Did not seek care at that time. She states she had somewhat similar symptoms a couple of months ago and was diagnosed with a urinary tract infection. She denies any fevers.   Past Medical History  Diagnosis Date  . Hypertension   . Coronary artery disease     angioplasty  . Indigestion   . Nausea   . Coughing   . Cancer ([place])     breast  . Cancer of stomach ([place])   . Shortness of breath dyspnea     Patient Active Problem List   Diagnosis Date Noted  . Gastric cancer ([place]) 04/01/2015    Past Surgical History  Procedure Laterality Date  . Breast surgery    . Abdominal hysterectomy    . Goiter removed Bilateral   . Joint replacement    . Knee arthroplasty Left   . Portacath placement Right 03/31/2015    Procedure: INSERTION PORT-A-CATH;  Surgeon: Leonie Green, MD;  Location: [facility] ORS;  Service: General;  Laterality: Right;    Current Outpatient Rx  Name  Route  Sig  Dispense  Refill  . acetaminophen  (TYLENOL) 325 MG tablet   Oral   Take 650 mg by mouth every 6 (six) hours as needed for moderate pain, fever or headache.         . benzonatate (TESSALON PERLES) 100 MG capsule   Oral   Take 1 capsule (100 mg total) by mouth every 6 (six) hours as needed for cough.   30 capsule   1   . HYDROcodone-acetaminophen (NORCO) 10-325 MG tablet   Oral   Take 1 tablet by mouth every 6 (six) hours as needed for moderate pain.   120 tablet   0   . lidocaine-prilocaine (EMLA) cream   Topical   Apply 1 application topically as needed. Apply to port 1-2 hours prior to chemotherapy appointment. Cover with plastic wrap.   30 g   2   . ondansetron (ZOFRAN) 8 MG tablet   Oral   Take 1 tablet (8 mg total) by mouth 2 (two) times daily as needed. Start on the third day after chemotherapy.   30 tablet   1   . pantoprazole (PROTONIX) 20 MG tablet   Oral   Take 1 tablet (20 mg total) by mouth daily.   30 tablet   5   . prochlorperazine (COMPAZINE) 10 MG tablet   Oral   Take 1 tablet (10 mg total) by mouth every 6 (six) hours as needed (Nausea or vomiting).   30 tablet  1     Allergies Review of patient's allergies indicates no known allergies.  No family history on file.  Social History Social History  Substance Use Topics  . Smoking status: Never Smoker   . Smokeless tobacco: None  . Alcohol Use: No    Review of Systems  Constitutional: Negative for fever. Cardiovascular: Negative for chest pain. Respiratory: Negative for shortness of breath. Gastrointestinal: Negative for abdominal pain, vomiting and diarrhea. Genitourinary: Negative for dysuria. Neurological: Positive for weakness, dizziness 10-point ROS otherwise negative.  ____________________________________________   PHYSICAL EXAM:  VITAL SIGNS: ED Triage Vitals  Enc Vitals Group     BP 05/06/15 0624 160/96 mmHg     Pulse Rate 05/06/15 0624 120     Resp 05/06/15 0624 20     Temp 05/06/15 0624 98 F (36.7  C)     Temp Source 05/06/15 0624 Oral     SpO2 05/06/15 0624 96 %     Weight 05/06/15 0624 165 lb (74.844 kg)     Height 05/06/15 0624 5\' 2"  (1.575 m)     Head Cir --      Peak Flow --      Pain Score 05/06/15 0625 0   Constitutional: Alert and oriented. Well appearing and in no distress. Eyes: Conjunctivae are normal. PERRL. Normal extraocular movements. ENT   Head: Normocephalic and atraumatic.   Nose: No congestion/rhinnorhea.   Mouth/Throat: Mucous membranes are moist.   Neck: No stridor. Hematological/Lymphatic/Immunilogical: No cervical lymphadenopathy. Cardiovascular: Tachycardic, regular rhythm.  No murmurs, rubs, or gallops. Respiratory: Normal respiratory effort without tachypnea nor retractions. Breath sounds are clear and equal bilaterally. No wheezes/rales/rhonchi. Gastrointestinal: Soft and minimally tender in the periumbilical and right abdomen. No distention. No rebound. No guarding. Genitourinary: Deferred Musculoskeletal: Normal range of motion in all extremities. No joint effusions.  2+ pitting edema to bilateral lower extremities. Neurologic:  Normal speech and language. No gross focal neurologic deficits are appreciated.  Skin:  Skin is warm, dry and intact. No rash noted. Psychiatric: Mood and affect are normal. Speech and behavior are normal. Patient exhibits appropriate insight and judgment.  ____________________________________________    LABS (pertinent positives/negatives)  Trop 0.36 WBC 48.4 K 4.5 Cr 0.49  ____________________________________________   EKG  I, Nance Pear, attending physician, personally viewed and interpreted this EKG  EKG Time: 0724 Rate: 114 Rhythm: ectopic atrial tachycardia Axis: normal Intervals: qtc 445 QRS: narrow ST changes: no st elevation Impression: abnormal ekg ____________________________________________    RADIOLOGY  CT Head IMPRESSION: No acute intracranial abnormality.   CTA  PE  IMPRESSION: No pulmonary emboli.  Moderate sized right pleural effusion layering dependently with associated volume loss in the right lung.  Innumerable metastatic lesions throughout the liver. Individual lesions seen in September of this year appear smaller, but there appear to be more numerous lesions. The study is not a complete or formal abdominal evaluation.  ____________________________________________   PROCEDURES  Procedure(s) performed: None  Critical Care performed: Yes  CRITICAL CARE Performed by: Nance Pear   Total critical care time: 30 minutes  Critical care time was exclusive of separately billable procedures and treating other patients.  Critical care was necessary to treat or prevent imminent or life-threatening deterioration.  Critical care was time spent personally by me on the following activities: development of treatment plan with patient and/or surrogate as well as nursing, discussions with consultants, evaluation of patient's response to treatment, examination of patient, obtaining history from patient or surrogate, ordering and performing treatments and  interventions, ordering and review of laboratory studies, ordering and review of radiographic studies, pulse oximetry and re-evaluation of patient's condition.   ____________________________________________   INITIAL IMPRESSION / ASSESSMENT AND PLAN / ED COURSE  Pertinent labs & imaging results that were available during my care of the patient were reviewed by me and considered in my medical decision making (see chart for details).  Patient presented to the emergency department today after an episode of lightheadedness and near syncope. Patient does have a history of coronary artery disease. Blood work was concerning for elevated troponin as well as elevated white blood cell count. Given the elevated troponin I did have some concern for possible PE being the cause of the patient's symptoms  of a CT and she was performed. This did not show any evidence of pulmonary embolism. Given this finding I do have concern that the patient suffered an episode of ACS. Will plan on admission to the hospitalist service for further workup and evaluation. ____________________________________________   FINAL CLINICAL IMPRESSION(S) / ED DIAGNOSES  Final diagnoses:  Leukocytosis  Elevated troponin  Dizziness     Nance Pear, MD 05/07/15 [id number]

## 2015-05-06 NOTE — ED Notes (Signed)
Dr. Archie Balboa notified of troponin 0.36

## 2015-05-06 NOTE — Care Management Obs Status (Signed)
[facility] NOTIFICATION   Patient Details  Name: Chloe Arnold MRN: [id number] Date of Birth: [birth year]   Medicare Observation Status Notification Given:  Yes  Patient admitted under observation.  Presented and explained observation notice.  Patient signed.  Original given to patient and copy placed in medical record.  Copy manually delivered to HIM     Katrina Stack, RN 05/06/2015, 1:34 PM

## 2015-05-06 NOTE — ED Notes (Signed)
Pt c/o weakness and fatigue, fell last week left temporal hematoma healing well.  Positive LOC, was not seen nor did call oncologist

## 2015-05-06 NOTE — ED Notes (Signed)
Pt is currently being treated for stomach cancer, pt reports increased fatigue and weakness, pt fell 1 week ago, pt has old bruise noted to left forehead, mask placed on pt, pt vomits occasionally

## 2015-05-07 LAB — TROPONIN I: Troponin I: 0.08 ng/mL — ABNORMAL HIGH (ref <0.031)

## 2015-05-07 MED ORDER — PROCHLORPERAZINE MALEATE 10 MG PO TABS: 10.0000 mg | ORAL_TABLET | Freq: Four times a day (QID) | ORAL | Status: DC | PRN

## 2015-05-07 MED ORDER — ASPIRIN 81 MG PO TBEC: 81.0000 mg | DELAYED_RELEASE_TABLET | Freq: Every day | ORAL | Status: DC

## 2015-05-07 NOTE — Progress Notes (Addendum)
Patient discharged via wheelchair and private vehicle. All discharge instructions given and patient verbalized understanding. Port deaccessed. 2 prescriptions escribed to [facility] patient made aware. Patient not in distress at time of discharge.

## 2015-05-07 NOTE — Discharge Summary (Signed)
[facility] [place] NAME: Chloe Arnold    MR#:  [id number]  DATE OF BIRTH:  24-Apr-1946  DATE OF ADMISSION:  05/06/2015 ADMITTING PHYSICIAN: Fritzi Mandes, MD  DATE OF DISCHARGE: 05/07/15  PRIMARY CARE PHYSICIAN: No PCP Per Patient    ADMISSION DIAGNOSIS:  Dizziness [R42] Leukocytosis [D72.829] Elevated troponin [R79.89]  DISCHARGE DIAGNOSIS:  Dizziness improved  SECONDARY DIAGNOSIS:   Past Medical History  Diagnosis Date  . Hypertension   . Coronary artery disease     angioplasty  . Indigestion   . Nausea   . Coughing   . Cancer ([place])     breast  . Cancer of stomach ([place])   . Shortness of breath dyspnea     HOSPITAL COURSE:  69 y.o. female with a known history of recurrent stomach cancer undergoing chemotherapy, hypertension, CAD with angioplasty in early 2000 comes to the emergency room after he she had a dizzy spell and felt near syncopal.  1. Elevated troponin without chest pain or shortness of breath in the setting of history of coronary artery disease with near syncopal episode today. Admit patient to telemetry floor Cardiac enzymes 3 0.36-->0.04-->0.04 Aspirin 81 mg daily  2. Recurrent cancer of the stomach undergoing chemotherapy at the cancer center  3. GERD continue Protonix  4. Coronary artery disease status post angioplasty in the remote past Cont on baby aspirin.  5. DVT prophylaxis subcutaneous Lovenox  Overall stable. D/c home  CONSULTS OBTAINED:    none DRUG ALLERGIES:  No Known Allergies  DISCHARGE MEDICATIONS:   Current Discharge Medication List    START taking these medications   Details  aspirin EC 81 MG EC tablet Take 1 tablet (81 mg total) by mouth daily. Qty: 30 tablet, Refills: 1      CONTINUE these medications which have CHANGED   Details  !! prochlorperazine (COMPAZINE) 10 MG tablet Take 1 tablet (10 mg total) by mouth every 6 (six) hours as needed (Nausea or  vomiting). Qty: 30 tablet, Refills: 0     !! - Potential duplicate medications found. Please discuss with provider.    CONTINUE these medications which have NOT CHANGED   Details  acetaminophen (TYLENOL) 325 MG tablet Take 650 mg by mouth every 6 (six) hours as needed for moderate pain, fever or headache.    benzonatate (TESSALON PERLES) 100 MG capsule Take 1 capsule (100 mg total) by mouth every 6 (six) hours as needed for cough. Qty: 30 capsule, Refills: 1    HYDROcodone-acetaminophen (NORCO) 10-325 MG tablet Take 1 tablet by mouth every 6 (six) hours as needed for moderate pain. Qty: 120 tablet, Refills: 0   Associated Diagnoses: Malignant neoplasm of stomach, unspecified location (HCC)    lidocaine-prilocaine (EMLA) cream Apply 1 application topically as needed. Apply to port 1-2 hours prior to chemotherapy appointment. Cover with plastic wrap. Qty: 30 g, Refills: 2    ondansetron (ZOFRAN) 8 MG tablet Take 1 tablet (8 mg total) by mouth 2 (two) times daily as needed. Start on the third day after chemotherapy. Qty: 30 tablet, Refills: 1   Associated Diagnoses: Malignant neoplasm of stomach, unspecified location (HCC)    ondansetron (ZOFRAN-ODT) 4 MG disintegrating tablet Take 4 mg by mouth every 8 (eight) hours as needed for nausea or vomiting.    pantoprazole (PROTONIX) 20 MG tablet Take 1 tablet (20 mg total) by mouth daily. Qty: 30 tablet, Refills: 5    !! prochlorperazine (COMPAZINE) 10 MG tablet  Take 1 tablet (10 mg total) by mouth every 6 (six) hours as needed (Nausea or vomiting). Qty: 30 tablet, Refills: 1   Associated Diagnoses: Malignant neoplasm of stomach, unspecified location [facility])     !! - Potential duplicate medications found. Please discuss with provider.      If you experience worsening of your admission symptoms, develop shortness of breath, life threatening emergency, suicidal or homicidal thoughts you must seek medical attention immediately by calling 911 or  calling your MD immediately  if symptoms less severe.  You Must read complete instructions/literature along with all the possible adverse reactions/side effects for all the Medicines you take and that have been prescribed to you. Take any new Medicines after you have completely understood and accept all the possible adverse reactions/side effects.   Please note  You were cared for by a hospitalist during your hospital stay. If you have any questions about your discharge medications or the care you received while you were in the hospital after you are discharged, you can call the unit and asked to speak with the hospitalist on call if the hospitalist that took care of you is not available. Once you are discharged, your primary care physician will handle any further medical issues. Please note that NO REFILLS for any discharge medications will be authorized once you are discharged, as it is imperative that you return to your primary care physician (or establish a relationship with a primary care physician if you do not have one) for your aftercare needs so that they can reassess your need for medications and monitor your lab values. Today   SUBJECTIVE   Doing well  VITAL SIGNS:  Blood pressure 147/62, pulse 84, temperature 98 F (36.7 C), temperature source Oral, resp. rate 16, height 5\' 2"  (1.575 m), weight 165 lb (74.844 kg), SpO2 95 %.  I/O:   Intake/Output Summary (Last 24 hours) at 05/07/15 0924 Last data filed at 05/06/15 2054  Gross per 24 hour  Intake    600 ml  Output    200 ml  Net    400 ml    PHYSICAL EXAMINATION:  GENERAL:  69 y.o.-year-old patient lying in the bed with no acute distress.  EYES: Pupils equal, round, reactive to light and accommodation. No scleral icterus. Extraocular muscles intact.  HEENT: Head atraumatic, normocephalic. Oropharynx and nasopharynx clear.  NECK:  Supple, no jugular venous distention. No thyroid enlargement, no tenderness.  LUNGS: Normal  breath sounds bilaterally, no wheezing, rales,rhonchi or crepitation. No use of accessory muscles of respiration.  CARDIOVASCULAR: S1, S2 normal. No murmurs, rubs, or gallops.  ABDOMEN: Soft, non-tender, non-distended. Bowel sounds present. No organomegaly or mass.  EXTREMITIES: No pedal edema, cyanosis, or clubbing.  NEUROLOGIC: Cranial nerves II through XII are intact. Muscle strength 5/5 in all extremities. Sensation intact. Gait not checked.  PSYCHIATRIC: The patient is alert and oriented x 3.  SKIN: No obvious rash, lesion, or ulcer.   DATA REVIEW:   CBC   Recent Labs Lab 05/06/15 0645  WBC 48.4*  HGB 8.9*  HCT 29.3*  PLT 319    Chemistries   Recent Labs Lab 05/06/15 0645  NA 135  K 4.5  CL 97*  CO2 26  GLUCOSE 112*  BUN 17  CREATININE 0.49  CALCIUM 8.6*  AST 64*  ALT 36  ALKPHOS 701*  BILITOT 1.1    Microbiology Results   No results found for this or any previous visit (from the past 240 hour(s)).  RADIOLOGY:  Ct Head Wo Contrast  05/06/2015  CLINICAL DATA:  69 year old passed out a few days ago and hit the left side of the head. Headache. EXAM: CT HEAD WITHOUT CONTRAST TECHNIQUE: Contiguous axial images were obtained from the base of the skull through the vertex without contrast. COMPARISON:  None FINDINGS: No evidence for acute hemorrhage, mass lesion, midline shift, hydrocephalus or large infarct. Visualized paranasal sinuses and mastoid air cells are clear. No evidence for a calvarial fracture. IMPRESSION: No acute intracranial abnormality. Electronically Signed   By: Markus Daft M.D.   On: 05/06/2015 08:40   Ct Angio Chest Pe W/cm &/or Wo Cm  05/06/2015  CLINICAL DATA:  Tachycardia in dizzy spells. Right-sided chest pain. Currently being treated for metastatic cancer. EXAM: CT ANGIOGRAPHY CHEST WITH CONTRAST TECHNIQUE: Multidetector CT imaging of the chest was performed using the standard protocol during bolus administration of intravenous contrast.  Multiplanar CT image reconstructions and MIPs were obtained to evaluate the vascular anatomy. CONTRAST:  51mL OMNIPAQUE IOHEXOL 350 MG/ML SOLN COMPARISON:  Chest radiography 03/31/2015.  CT abdomen 03/06/2015. FINDINGS: Pulmonary arterial opacification is excellent. There are no pulmonary emboli. There is atherosclerosis of the aorta and of the coronary arteries but no evidence of acute dissection. There are no enlarged hilar or mediastinal lymph nodes. There is a moderate size pleural effusion on the right layering dependently with pulmonary atelectasis dependently and moderate volume loss in the right lower lobe. There are no pulmonary masses or nodules. Innumerable metastatic lesions are again seen throughout the liver are as was demonstrated on 03/06/2015. Individual lesions that were visible on the previous study appear smaller. For instance, a 3.4 cm lesion previously seen in the left lobe now measures approximately 2.4 cm in diameter. However, the number of the lesions throughout the liver appear more numerous. This was not a dedicated abdominal study. No destructive bone lesions are seen. Review of the MIP images confirms the above findings. IMPRESSION: No pulmonary emboli. Moderate sized right pleural effusion layering dependently with associated volume loss in the right lung. Innumerable metastatic lesions throughout the liver. Individual lesions seen in September of this year appear smaller, but there appear to be more numerous lesions. The study is not a complete or formal abdominal evaluation. Electronically Signed   By: Nelson Chimes M.D.   On: 05/06/2015 09:01     Management plans discussed with the patient, family and they are in agreement.  CODE STATUS:     Code Status Orders        Start     Ordered   05/06/15 1101  Full code   Continuous     05/06/15 1100      TOTAL TIME TAKING CARE OF THIS PATIENT: 40 minutes.    Idelia Caudell M.D on 05/07/2015 at 9:24 AM  Between 7am to 6pm -  Pager - [phone] After 6pm go to [email or web address] - password [email or web address] [facility]  [facility]  Office  [phone]  CC: Primary care physician; No PCP Per Patient

## 2015-05-09 ENCOUNTER — Inpatient Hospital Stay (HOSPITAL_BASED_OUTPATIENT_CLINIC_OR_DEPARTMENT_OTHER): Payer: Medicare Other | Admitting: Oncology

## 2015-05-09 ENCOUNTER — Inpatient Hospital Stay: Payer: Medicare Other

## 2015-05-09 ENCOUNTER — Inpatient Hospital Stay: Payer: Medicare Other | Attending: Oncology

## 2015-05-09 VITALS — BP 125/84 | HR 112 | Temp 95.9°F | Resp 18 | Wt 172.2 lb

## 2015-05-09 DIAGNOSIS — K5900 Constipation, unspecified: Secondary | ICD-10-CM

## 2015-05-09 DIAGNOSIS — C787 Secondary malignant neoplasm of liver and intrahepatic bile duct: Secondary | ICD-10-CM | POA: Insufficient documentation

## 2015-05-09 DIAGNOSIS — D72829 Elevated white blood cell count, unspecified: Secondary | ICD-10-CM

## 2015-05-09 DIAGNOSIS — C169 Malignant neoplasm of stomach, unspecified: Secondary | ICD-10-CM | POA: Insufficient documentation

## 2015-05-09 DIAGNOSIS — R5383 Other fatigue: Secondary | ICD-10-CM

## 2015-05-09 DIAGNOSIS — R531 Weakness: Secondary | ICD-10-CM

## 2015-05-09 DIAGNOSIS — D649 Anemia, unspecified: Secondary | ICD-10-CM | POA: Diagnosis not present

## 2015-05-09 DIAGNOSIS — R55 Syncope and collapse: Secondary | ICD-10-CM | POA: Insufficient documentation

## 2015-05-09 DIAGNOSIS — R42 Dizziness and giddiness: Secondary | ICD-10-CM | POA: Insufficient documentation

## 2015-05-09 DIAGNOSIS — Z5111 Encounter for antineoplastic chemotherapy: Secondary | ICD-10-CM | POA: Insufficient documentation

## 2015-05-09 DIAGNOSIS — J9811 Atelectasis: Secondary | ICD-10-CM | POA: Insufficient documentation

## 2015-05-09 DIAGNOSIS — Z803 Family history of malignant neoplasm of breast: Secondary | ICD-10-CM | POA: Insufficient documentation

## 2015-05-09 DIAGNOSIS — R05 Cough: Secondary | ICD-10-CM | POA: Insufficient documentation

## 2015-05-09 DIAGNOSIS — R079 Chest pain, unspecified: Secondary | ICD-10-CM

## 2015-05-09 DIAGNOSIS — R12 Heartburn: Secondary | ICD-10-CM

## 2015-05-09 DIAGNOSIS — J90 Pleural effusion, not elsewhere classified: Secondary | ICD-10-CM | POA: Insufficient documentation

## 2015-05-09 DIAGNOSIS — K219 Gastro-esophageal reflux disease without esophagitis: Secondary | ICD-10-CM | POA: Insufficient documentation

## 2015-05-09 DIAGNOSIS — Z853 Personal history of malignant neoplasm of breast: Secondary | ICD-10-CM | POA: Insufficient documentation

## 2015-05-09 DIAGNOSIS — I2510 Atherosclerotic heart disease of native coronary artery without angina pectoris: Secondary | ICD-10-CM | POA: Insufficient documentation

## 2015-05-09 DIAGNOSIS — Z7982 Long term (current) use of aspirin: Secondary | ICD-10-CM | POA: Insufficient documentation

## 2015-05-09 DIAGNOSIS — Z8249 Family history of ischemic heart disease and other diseases of the circulatory system: Secondary | ICD-10-CM | POA: Insufficient documentation

## 2015-05-09 DIAGNOSIS — Z79899 Other long term (current) drug therapy: Secondary | ICD-10-CM | POA: Insufficient documentation

## 2015-05-09 DIAGNOSIS — Z9223 Personal history of estrogen therapy: Secondary | ICD-10-CM | POA: Insufficient documentation

## 2015-05-09 LAB — CBC WITH DIFFERENTIAL/PLATELET
BASOS ABS: 0 10*3/uL (ref 0–0.1)
Band Neutrophils: 3 %
Basophils Relative: 0 %
EOS ABS: 0 10*3/uL (ref 0–0.7)
Eosinophils Relative: 0 %
HCT: 29.4 % — ABNORMAL LOW (ref 35.0–47.0)
HEMOGLOBIN: 9 g/dL — AB (ref 12.0–16.0)
LYMPHS ABS: 4.6 10*3/uL — AB (ref 1.0–3.6)
Lymphocytes Relative: 9 %
MCH: 28.5 pg (ref 26.0–34.0)
MCHC: 30.7 g/dL — AB (ref 32.0–36.0)
MCV: 93.1 fL (ref 80.0–100.0)
MONO ABS: 4.6 10*3/uL — AB (ref 0.2–0.9)
Monocytes Relative: 9 %
NEUTROS ABS: 42.2 10*3/uL — AB (ref 1.4–6.5)
NEUTROS PCT: 79 %
PLATELETS: 347 10*3/uL (ref 150–440)
RBC: 3.16 MIL/uL — AB (ref 3.80–5.20)
RDW: 25.1 % — AB (ref 11.5–14.5)
Smear Review: ADEQUATE
WBC: 51.4 10*3/uL — AB (ref 3.6–11.0)

## 2015-05-09 LAB — COMPREHENSIVE METABOLIC PANEL
ALBUMIN: 2 g/dL — AB (ref 3.5–5.0)
ALK PHOS: 769 U/L — AB (ref 38–126)
ALT: 32 U/L (ref 14–54)
ANION GAP: 10 (ref 5–15)
AST: 56 U/L — ABNORMAL HIGH (ref 15–41)
BUN: 12 mg/dL (ref 6–20)
CALCIUM: 8.3 mg/dL — AB (ref 8.9–10.3)
CO2: 26 mmol/L (ref 22–32)
Chloride: 93 mmol/L — ABNORMAL LOW (ref 101–111)
Creatinine, Ser: 0.65 mg/dL (ref 0.44–1.00)
GFR calc non Af Amer: >60 mL/min (ref >60)
Glucose, Bld: 92 mg/dL (ref 65–99)
POTASSIUM: 4.6 mmol/L (ref 3.5–5.1)
SODIUM: 129 mmol/L — AB (ref 135–145)
Total Bilirubin: 1.4 mg/dL — ABNORMAL HIGH (ref 0.3–1.2)
Total Protein: 6.1 g/dL — ABNORMAL LOW (ref 6.5–8.1)

## 2015-05-09 MED ORDER — HEPARIN SOD (PORK) LOCK FLUSH 100 UNIT/ML IV SOLN: 500.0000 [IU] | Freq: Once | INTRAVENOUS | Status: DC

## 2015-05-09 MED ORDER — SODIUM CHLORIDE 0.9 % IV SOLN
800.0000 mg/m2/d | INTRAVENOUS | Status: DC
  Administered 2015-05-09: 5700 mg via INTRAVENOUS
  Filled 2015-05-09: qty 114

## 2015-05-09 MED ORDER — SODIUM CHLORIDE 0.9 % IJ SOLN
10.0000 mL | INTRAMUSCULAR | Status: DC | PRN
  Administered 2015-05-09: 10 mL via INTRAVENOUS
  Filled 2015-05-09: qty 10

## 2015-05-09 MED ORDER — PALONOSETRON HCL INJECTION 0.25 MG/5ML
0.2500 mg | Freq: Once | INTRAVENOUS | Status: AC
  Administered 2015-05-09: 0.25 mg via INTRAVENOUS

## 2015-05-09 MED ORDER — POTASSIUM CHLORIDE 2 MEQ/ML IV SOLN
Freq: Once | INTRAVENOUS | Status: AC
  Administered 2015-05-09: 11:00:00 via INTRAVENOUS
  Filled 2015-05-09: qty 1000

## 2015-05-09 MED ORDER — SODIUM CHLORIDE 0.9 % IV SOLN
75.0000 mg/m2 | Freq: Once | INTRAVENOUS | Status: AC
  Administered 2015-05-09: 134 mg via INTRAVENOUS
  Filled 2015-05-09: qty 134

## 2015-05-09 MED ORDER — MEGESTROL ACETATE 40 MG/ML PO SUSP: 400.0000 mg | Freq: Every day | ORAL | Status: AC

## 2015-05-09 MED ORDER — SODIUM CHLORIDE 0.9 % IV SOLN
Freq: Once | INTRAVENOUS | Status: AC
  Administered 2015-05-09: 11:00:00 via INTRAVENOUS
  Filled 2015-05-09: qty 1000

## 2015-05-09 MED ORDER — FOSAPREPITANT DIMEGLUMINE INJECTION 150 MG
Freq: Once | INTRAVENOUS | Status: AC
  Administered 2015-05-09: 14:00:00 via INTRAVENOUS
  Filled 2015-05-09: qty 5

## 2015-05-09 NOTE — Progress Notes (Signed)
ANC results are not back yet. MD, Dr. Grayland Ormond, aware of current lab results. Per MD, Dr. Grayland Ormond, order: proceed with chemotherapy treatment today.  1345-Patient went to the restroom once and missed the urine hat for measurement of urine. Patients second trip to the restroom yielded a urine output of 36ml. Patients total measured urine output at present time: 51ml. MD, Dr. Grayland Ormond, notified via telephone. Per MD, Dr. Grayland Ormond, order: proceed with chemotherapy treatment at this time.

## 2015-05-09 NOTE — Progress Notes (Signed)
Patient had some dizziness with syncopal episode last week and went to the ER where she had work-up.  Her stomach pain has increased so her Hydrocodone was increased at her last refill.  Still has bilateral leg edema.

## 2015-05-10 ENCOUNTER — Inpatient Hospital Stay
Admission: EM | Admit: 2015-05-10 | Discharge: 2015-05-14 | DRG: 065 | Disposition: A | Discharge status: Skilled Nursing Facility | Payer: Medicare Other | Attending: Internal Medicine | Admitting: Internal Medicine

## 2015-05-10 ENCOUNTER — Emergency Department: Payer: Medicare Other

## 2015-05-10 ENCOUNTER — Encounter: Payer: Self-pay | Admitting: Emergency Medicine

## 2015-05-10 DIAGNOSIS — R55 Syncope and collapse: Secondary | ICD-10-CM

## 2015-05-10 DIAGNOSIS — E782 Mixed hyperlipidemia: Secondary | ICD-10-CM | POA: Diagnosis present

## 2015-05-10 DIAGNOSIS — Z85028 Personal history of other malignant neoplasm of stomach: Secondary | ICD-10-CM

## 2015-05-10 DIAGNOSIS — C787 Secondary malignant neoplasm of liver and intrahepatic bile duct: Secondary | ICD-10-CM | POA: Diagnosis present

## 2015-05-10 DIAGNOSIS — R748 Abnormal levels of other serum enzymes: Secondary | ICD-10-CM

## 2015-05-10 DIAGNOSIS — C169 Malignant neoplasm of stomach, unspecified: Secondary | ICD-10-CM | POA: Diagnosis present

## 2015-05-10 DIAGNOSIS — C50919 Malignant neoplasm of unspecified site of unspecified female breast: Secondary | ICD-10-CM | POA: Diagnosis present

## 2015-05-10 DIAGNOSIS — I10 Essential (primary) hypertension: Secondary | ICD-10-CM | POA: Diagnosis present

## 2015-05-10 DIAGNOSIS — Z79899 Other long term (current) drug therapy: Secondary | ICD-10-CM

## 2015-05-10 DIAGNOSIS — H539 Unspecified visual disturbance: Secondary | ICD-10-CM

## 2015-05-10 DIAGNOSIS — G459 Transient cerebral ischemic attack, unspecified: Secondary | ICD-10-CM

## 2015-05-10 DIAGNOSIS — R000 Tachycardia, unspecified: Secondary | ICD-10-CM | POA: Diagnosis present

## 2015-05-10 DIAGNOSIS — J90 Pleural effusion, not elsewhere classified: Secondary | ICD-10-CM | POA: Diagnosis present

## 2015-05-10 DIAGNOSIS — Z7982 Long term (current) use of aspirin: Secondary | ICD-10-CM

## 2015-05-10 DIAGNOSIS — I2510 Atherosclerotic heart disease of native coronary artery without angina pectoris: Secondary | ICD-10-CM | POA: Diagnosis present

## 2015-05-10 DIAGNOSIS — Z90710 Acquired absence of both cervix and uterus: Secondary | ICD-10-CM

## 2015-05-10 DIAGNOSIS — I639 Cerebral infarction, unspecified: Secondary | ICD-10-CM | POA: Diagnosis not present

## 2015-05-10 DIAGNOSIS — I6522 Occlusion and stenosis of left carotid artery: Secondary | ICD-10-CM | POA: Diagnosis present

## 2015-05-10 DIAGNOSIS — Z96652 Presence of left artificial knee joint: Secondary | ICD-10-CM | POA: Diagnosis present

## 2015-05-10 LAB — CBC
HEMATOCRIT: 31.2 % — AB (ref 35.0–47.0)
Hemoglobin: 9.5 g/dL — ABNORMAL LOW (ref 12.0–16.0)
MCH: 28.7 pg (ref 26.0–34.0)
MCHC: 30.5 g/dL — AB (ref 32.0–36.0)
MCV: 94.2 fL (ref 80.0–100.0)
PLATELETS: 397 10*3/uL (ref 150–440)
RBC: 3.31 MIL/uL — ABNORMAL LOW (ref 3.80–5.20)
RDW: 25.8 % — AB (ref 11.5–14.5)
WBC: 58.8 10*3/uL (ref 3.6–11.0)

## 2015-05-10 LAB — BASIC METABOLIC PANEL
ANION GAP: 10 (ref 5–15)
BUN: 22 mg/dL — AB (ref 6–20)
CALCIUM: 8.3 mg/dL — AB (ref 8.9–10.3)
CO2: 22 mmol/L (ref 22–32)
Chloride: 99 mmol/L — ABNORMAL LOW (ref 101–111)
Creatinine, Ser: 0.7 mg/dL (ref 0.44–1.00)
GFR calc Af Amer: >60 mL/min (ref >60)
GLUCOSE: 193 mg/dL — AB (ref 65–99)
Potassium: 5 mmol/L (ref 3.5–5.1)
Sodium: 131 mmol/L — ABNORMAL LOW (ref 135–145)

## 2015-05-10 LAB — TROPONIN I
TROPONIN I: 0.11 ng/mL — AB (ref <0.031)
Troponin I: 0.1 ng/mL — ABNORMAL HIGH (ref <0.031)
Troponin I: 0.07 ng/mL — ABNORMAL HIGH (ref <0.031)

## 2015-05-10 LAB — GLUCOSE, CAPILLARY: Glucose-Capillary: 180 mg/dL — ABNORMAL HIGH (ref 65–99)

## 2015-05-10 MED ORDER — ENOXAPARIN SODIUM 40 MG/0.4ML ~~LOC~~ SOLN
40.0000 mg | SUBCUTANEOUS | Status: DC
Start: 2015-05-10 — End: 2015-05-14
  Administered 2015-05-10 – 2015-05-13 (×4): 40 mg via SUBCUTANEOUS
  Filled 2015-05-10 (×4): qty 0.4

## 2015-05-10 MED ORDER — ASPIRIN 300 MG RE SUPP: 300.0000 mg | Freq: Every day | RECTAL | Status: DC

## 2015-05-10 MED ORDER — IOHEXOL 350 MG/ML SOLN
80.0000 mL | Freq: Once | INTRAVENOUS | Status: AC | PRN
Start: 2015-05-10 — End: 2015-05-10
  Administered 2015-05-10: 80 mL via INTRAVENOUS

## 2015-05-10 MED ORDER — STROKE: EARLY STAGES OF RECOVERY BOOK
Freq: Once | Status: AC
  Administered 2015-05-13: 11:00:00

## 2015-05-10 MED ORDER — SODIUM CHLORIDE 0.9 % IV BOLUS (SEPSIS)
1000.0000 mL | Freq: Once | INTRAVENOUS | Status: AC
  Administered 2015-05-10: 1000 mL via INTRAVENOUS

## 2015-05-10 MED ORDER — ONDANSETRON HCL 4 MG/2ML IJ SOLN
4.0000 mg | Freq: Once | INTRAMUSCULAR | Status: AC
  Administered 2015-05-10: 4 mg via INTRAVENOUS
  Filled 2015-05-10: qty 2

## 2015-05-10 MED ORDER — ASPIRIN 325 MG PO TABS
325.0000 mg | ORAL_TABLET | Freq: Every day | ORAL | Status: DC
Start: 2015-05-10 — End: 2015-05-14
  Administered 2015-05-11 – 2015-05-14 (×4): 325 mg via ORAL
  Filled 2015-05-10 (×4): qty 1

## 2015-05-10 MED ORDER — ACETAMINOPHEN 500 MG PO TABS
1000.0000 mg | ORAL_TABLET | Freq: Once | ORAL | Status: AC
  Administered 2015-05-10: 1000 mg via ORAL
  Filled 2015-05-10: qty 2

## 2015-05-10 NOTE — ED Provider Notes (Signed)
[facility] Emergency Department Provider Note  ____________________________________________  Time seen: Approximately 1:53 PM  I have reviewed the triage vital signs and the nursing notes.   HISTORY  Chief Complaint Weakness    HPI Chloe Arnold is a 69 y.o. female with a history of stomach cancer with metastases to the liver currently, CAD status post remote angioplasty, HTN, presenting with visual changes, presyncope. Patient was sitting on the couch at home receiving her chemotherapy when she had acute onset of a lightheaded sensation with blurred vision except for the left peripheral visual field.  She felt like if she stood up that she would pass out.  She didn't get into the car with the family and her symptoms worsen. At that time she also may have had a left facial droop with left thigh numbness. The numbness has completely resolved. Per EMS, the patient was pale and diaphoretic on arrival.  No blood sugar was obtained. The patient was admitted [phone] for similar presyncopal episode with positive troponins. She has been feeling well in the meanwhile and has been taking good by mouth, without any fever, chills, abdominal pain, numbness tickling or weakness, headache, chest pain, palpitations, shortness of breath.   Past Medical History  Diagnosis Date  . Hypertension   . Coronary artery disease     angioplasty  . Indigestion   . Nausea   . Coughing   . Cancer ([place])     breast  . Cancer of stomach ([place])   . Shortness of breath dyspnea     Patient Active Problem List   Diagnosis Date Noted  . Syncope 05/06/2015  . Gastric cancer ([place]) 04/01/2015    Past Surgical History  Procedure Laterality Date  . Breast surgery    . Abdominal hysterectomy    . Goiter removed Bilateral   . Joint replacement    . Knee arthroplasty Left   . Portacath placement Right 03/31/2015    Procedure: INSERTION PORT-A-CATH;  Surgeon: Leonie Green, MD;   Location: [facility] ORS;  Service: General;  Laterality: Right;    Current Outpatient Rx  Name  Route  Sig  Dispense  Refill  . acetaminophen (TYLENOL) 325 MG tablet   Oral   Take 650 mg by mouth every 6 (six) hours as needed for moderate pain, fever or headache.         Marland Kitchen aspirin EC 81 MG EC tablet   Oral   Take 1 tablet (81 mg total) by mouth daily.   30 tablet   1   . benzonatate (TESSALON PERLES) 100 MG capsule   Oral   Take 1 capsule (100 mg total) by mouth every 6 (six) hours as needed for cough.   30 capsule   1   . HYDROcodone-acetaminophen (NORCO) 10-325 MG tablet   Oral   Take 1 tablet by mouth every 6 (six) hours as needed for moderate pain.   120 tablet   0   . lidocaine-prilocaine (EMLA) cream   Topical   Apply 1 application topically as needed. Apply to port 1-2 hours prior to chemotherapy appointment. Cover with plastic wrap.   30 g   2   . megestrol (MEGACE) 40 MG/ML suspension   Oral   Take 10 mLs (400 mg total) by mouth daily.   240 mL   0   . ondansetron (ZOFRAN) 8 MG tablet   Oral   Take 1 tablet (8 mg total) by mouth 2 (two) times daily as  needed. Start on the third day after chemotherapy.   30 tablet   1   . ondansetron (ZOFRAN-ODT) 4 MG disintegrating tablet   Oral   Take 4 mg by mouth every 8 (eight) hours as needed for nausea or vomiting.         . pantoprazole (PROTONIX) 20 MG tablet   Oral   Take 1 tablet (20 mg total) by mouth daily.   30 tablet   5   . prochlorperazine (COMPAZINE) 10 MG tablet   Oral   Take 1 tablet (10 mg total) by mouth every 6 (six) hours as needed (Nausea or vomiting).   30 tablet   1   . prochlorperazine (COMPAZINE) 10 MG tablet   Oral   Take 1 tablet (10 mg total) by mouth every 6 (six) hours as needed (Nausea or vomiting).   30 tablet   0     Allergies Review of patient's allergies indicates no known allergies.  History reviewed. No pertinent family history.  Social History Social History   Substance Use Topics  . Smoking status: Never Smoker   . Smokeless tobacco: None  . Alcohol Use: No    Review of Systems Constitutional: No fever/chills. Positive diaphoresis. Negative syncope. Positive presyncope. Eyes: Blurred vision except for the left peripheral field. ENT: No sore throat. Cardiovascular: Denies chest pain, palpitations. Respiratory: Denies shortness of breath.  No cough. Gastrointestinal: No abdominal pain.  No nausea, no vomiting.  No diarrhea.  No constipation. Genitourinary: Negative for dysuria. Musculoskeletal: Negative for back pain. Skin: Negative for rash. Neurological: Negative for headaches, focal weakness or numbness.  10-point ROS otherwise negative.  ____________________________________________   PHYSICAL EXAM:  VITAL SIGNS: ED Triage Vitals  Enc Vitals Group     BP 05/10/15 1343 136/96 mmHg     Pulse Rate 05/10/15 1343 116     Resp 05/10/15 1343 16     Temp 05/10/15 1343 98 F (36.7 C)     Temp Source 05/10/15 1343 Oral     SpO2 05/10/15 1338 95 %     Weight 05/10/15 1343 172 lb (78.019 kg)     Height 05/10/15 1343 4\' 11"  (1.499 m)     Head Cir --      Peak Flow --      Pain Score 05/10/15 1344 0     Pain Loc --      Pain Edu? --      Excl. in [place]? --     Constitutional: Patient is alert and oriented 3. She is able to answer questions appropriately.  Eyes: Conjunctivae are normal.  EOMI. PERRLA. Head: Atraumatic. Nose: No congestion/rhinnorhea. Mouth/Throat: Mucous membranes are mild to dry.  Neck: No stridor.  Supple.  No JVD. Cardiovascular: Normal rate, regular rhythm. No murmurs, rubs or gallops. Right upper chest has poor in place that is clean dry and intact without swelling, erythema or warmth. Respiratory: Normal respiratory effort.  No retractions. Lungs CTAB.  No wheezes, rales or ronchi. Gastrointestinal: Soft and nontender. No distention. No peritoneal signs. Musculoskeletal: Bilateral symmetric lower extremity  edema. Neurologic: Alert and oriented 3. Speech is clear. Face and smile symmetric. EOMI. PERRLA. Visual field deficits in the right upper and lower visual fields bilaterally. This exam is inconsistent when it is repeated. No pronator drift. 5 out of 5 grip, biceps, triceps, hip flexors, plantar flexion and dorsiflexion. Normal sensation to light touch in the bilateral upper extremities, and face; symmetrically decreased sensation to light touch in the  bilateral lower extremities that the patient attributes to swelling.Cindee Salt is normal on the left but the patient does demonstrate ataxia on the right. Skin:  Skin is warm, dry and intact. No rash noted. Psychiatric: Mood and affect are normal. Speech and behavior are normal.  Normal judgement.  ____________________________________________   LABS (all labs ordered are listed, but only abnormal results are displayed)  Labs Reviewed  CBC - Abnormal; Notable for the following:    WBC 58.8 (*)    RBC 3.31 (*)    Hemoglobin 9.5 (*)    HCT 31.2 (*)    MCHC 30.5 (*)    RDW 25.8 (*)    All other components within normal limits  BASIC METABOLIC PANEL - Abnormal; Notable for the following:    Sodium 131 (*)    Chloride 99 (*)    Glucose, Bld 193 (*)    BUN 22 (*)    Calcium 8.3 (*)    All other components within normal limits  TROPONIN I - Abnormal; Notable for the following:    Troponin I 0.10 (*)    All other components within normal limits  GLUCOSE, CAPILLARY - Abnormal; Notable for the following:    Glucose-Capillary 180 (*)    All other components within normal limits  CULTURE, BLOOD (ROUTINE X 2)  CULTURE, BLOOD (ROUTINE X 2)  URINALYSIS COMPLETEWITH MICROSCOPIC ([facility] ONLY)   ____________________________________________  EKG  ED ECG REPORT I, Eula Listen, the attending physician, personally viewed and interpreted this ECG.   Date: 05/10/2015  EKG Time: 1339  Rate: 115  Rhythm: Sinus tachycardia  Axis:  Leftward  Intervals:none  ST&T Change: Specific T-wave inversion in V1. No ST elevation. No ischemic changes.  ____________________________________________  RADIOLOGY  Dg Chest 2 View  05/10/2015  CLINICAL DATA:  Dizziness, near syncope EXAM: CHEST  2 VIEW COMPARISON:  03/31/2015 FINDINGS: Heart size and vascular pattern normal. Stable Port-A-Cath on the right. Left lung is clear. On the right there is a new small to moderate effusion with underlying compressive atelectasis. IMPRESSION: New right pleural effusion Electronically Signed   By: Skipper Cliche M.D.   On: 05/10/2015 14:23   Ct Head W Wo Contrast  05/10/2015  CLINICAL DATA:  69 year old hypertensive female with stomach cancer on chemotherapy presenting with episode of dizziness with leg weakness. Subsequent encounter. EXAM: CT HEAD WITHOUT AND WITH CONTRAST TECHNIQUE: Contiguous axial images were obtained from the base of the skull through the vertex without and with intravenous contrast CONTRAST:  58mL OMNIPAQUE IOHEXOL 350 MG/ML SOLN COMPARISON:  05/06/2015 head CT. FINDINGS: No CT evidence of large acute infarct. No skull fracture or intracranial hemorrhage. No osseous destructive lesion or abnormal intracranial enhancing lesion to suggest presence of intracranial metastatic disease. Mild atrophy without hydrocephalus. Vascular calcifications. Orbital structures unremarkable. Mastoid air cells, middle ear cavities and visualized paranasal sinuses are clear. IMPRESSION: No CT evidence of large acute infarct. No skull fracture or intracranial hemorrhage. No osseous destructive lesion or abnormal intracranial enhancing lesion to suggest presence of intracranial metastatic disease. Electronically Signed   By: Genia Del M.D.   On: 05/10/2015 14:43    ____________________________________________   PROCEDURES  Procedure(s) performed: None  Critical Care performed: No ____________________________________________   INITIAL  IMPRESSION / ASSESSMENT AND PLAN / ED COURSE  Pertinent labs & imaging results that were available during my care of the patient were reviewed by me and considered in my medical decision making (see chart for details).  68 y.o. female currently  on chemotherapy for recurrent stomach cancer presenting with presyncope, visual field deficit, sensory deficit. The patient's examination does not correlate a single area of the brain, however I will evaluate her for CVA. Other possibilities include hypoglycemia so blood sugar has been obtained which is normal at 180. Consider electrolyte abnormality. Consider dehydration. The patient was recently here with a elevated troponin and I will recheck this as well. Her EKG is reassuring for not having any active ischemia.  ----------------------------------------- 3:07 PM on 05/10/2015 -----------------------------------------  The patient labs show white blood cell count in the 50s which is baseline for her since the initiation of her chemotherapy. She has a mild hyponatremia which I am treating with IV fluids. Her troponin today is 0.10 which is higher than her discharge troponin from the 26th. As an isolated number, it is unclear whether this is an acute cardiac event or strain, however, the trend will be important. I will plan to admit her for this. Her CT does not show any new findings including metastatic disease. She does have a new right pleural effusion. In the absence of a cough or fever, I will not empirically treat her for pneumonia at this time but will relay the information to the hospitalist who can continue to monitor her. Blood cultures will be sent as well. ____________________________________________  FINAL CLINICAL IMPRESSION(S) / ED DIAGNOSES  Final diagnoses:  Pleural effusion, right  High level of cardiac marker  Pre-syncope  Visual changes      NEW MEDICATIONS STARTED DURING THIS VISIT:  New Prescriptions   No medications on file      Eula Listen, MD 05/10/15 [phone]

## 2015-05-10 NOTE — ED Notes (Signed)
Pt brought to [place] by [facility] was on her way to pay bills pt states she started getting dizzy and her sight became dark she states. Pt states she was trying to get out of the car and got really weak and almost fell pts family tried to get pt back in the car and family states she wasn't able to move her legs. Pt does have a continuous pump for chemo through her port on the right side of her chest. Pt became pale and diaphoretic
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
## 2015-05-10 NOTE — Progress Notes (Signed)
Patient arrived from ED awake and alert complaining of blurred vision. Telemetry verified box number [id number] NSR 76. Vital signs stable. Skin assessment verified with Patent examiner. Patient oriented to room and safety screen done and reviewed with patient. Patient verbalized understanding and safety contract signed. Bed in lowest position call light in reach and bed alarm on. Will continue to monitor.

## 2015-05-11 ENCOUNTER — Observation Stay: Payer: Medicare Other

## 2015-05-11 ENCOUNTER — Observation Stay
Admit: 2015-05-11 | Discharge: 2015-05-11 | Disposition: A | Discharge status: Home / Self Care | Payer: Medicare Other | Attending: Internal Medicine | Admitting: Internal Medicine

## 2015-05-11 ENCOUNTER — Encounter: Payer: Self-pay | Admitting: Radiology

## 2015-05-11 LAB — URINALYSIS COMPLETE WITH MICROSCOPIC (ARMC ONLY)
BILIRUBIN URINE: NEGATIVE
GLUCOSE, UA: NEGATIVE mg/dL
HGB URINE DIPSTICK: NEGATIVE
KETONES UR: NEGATIVE mg/dL
LEUKOCYTES UA: NEGATIVE
NITRITE: NEGATIVE
Protein, ur: NEGATIVE mg/dL
SPECIFIC GRAVITY, URINE: 1.055 — AB (ref 1.005–1.030)
pH: 5 (ref 5.0–8.0)

## 2015-05-11 LAB — LIPID PANEL
Cholesterol: 203 mg/dL — ABNORMAL HIGH (ref 0–200)
HDL: 11 mg/dL — AB (ref >40)
LDL Cholesterol: 164 mg/dL — ABNORMAL HIGH (ref 0–99)
TRIGLYCERIDES: 139 mg/dL (ref <150)
Total CHOL/HDL Ratio: 18.5 RATIO
VLDL: 28 mg/dL (ref 0–40)

## 2015-05-11 LAB — TROPONIN I: Troponin I: 0.25 ng/mL — ABNORMAL HIGH (ref <0.031)

## 2015-05-11 MED ORDER — ASPIRIN 81 MG PO CHEW: 81.0000 mg | CHEWABLE_TABLET | Freq: Every day | ORAL | Status: DC

## 2015-05-11 MED ORDER — ATORVASTATIN CALCIUM 20 MG PO TABS
40.0000 mg | ORAL_TABLET | Freq: Every day | ORAL | Status: DC
  Administered 2015-05-11 – 2015-05-13 (×3): 40 mg via ORAL
  Filled 2015-05-11 (×3): qty 2

## 2015-05-11 MED ORDER — PANTOPRAZOLE SODIUM 40 MG PO TBEC
40.0000 mg | DELAYED_RELEASE_TABLET | Freq: Every day | ORAL | Status: DC
  Administered 2015-05-11 – 2015-05-14 (×4): 40 mg via ORAL
  Filled 2015-05-11 (×4): qty 1

## 2015-05-11 MED ORDER — PROCHLORPERAZINE MALEATE 10 MG PO TABS: 10.0000 mg | ORAL_TABLET | Freq: Four times a day (QID) | ORAL | Status: DC | PRN

## 2015-05-11 MED ORDER — IOHEXOL 350 MG/ML SOLN
80.0000 mL | Freq: Once | INTRAVENOUS | Status: AC | PRN
  Administered 2015-05-11: 80 mL via INTRAVENOUS

## 2015-05-11 MED ORDER — HYDROCODONE-ACETAMINOPHEN 10-325 MG PO TABS: 1.0000 | ORAL_TABLET | Freq: Four times a day (QID) | ORAL | Status: DC | PRN

## 2015-05-11 NOTE — Care Management Obs Status (Signed)
[facility] NOTIFICATION   Patient Details  Name: Chloe Arnold MRN: [id number] Date of Birth: 02/17/1946   Medicare Observation Status Notification Given:  Yes  Patient admitted under observation.  Presented and explained observation notice.  Patient signed.  Original given to patient and copy placed in medical record.  Copy manually delivered to HIM     Katrina Stack, RN 05/11/2015, 8:16 AM

## 2015-05-11 NOTE — Progress Notes (Signed)
Speech Therapy Note:  Reviewed chart and consulted NSG. ST attempted to visit Pt 3x today, but she has been away for tests. NSG reported that Pt ate breakfast of regular solids with thin liquids and demonstrated no immediate or overt s/s of aspiration during PO intake. Pt reports no concerns with swallow. ST is available for f/u should Pt require skilled services, but no ST skilled interventions are indicated at this time. Updated NSG.

## 2015-05-11 NOTE — Progress Notes (Signed)
PT Cancellation Note  Patient Details Name: Chloe Arnold MRN: [id number] DOB: July 05, 1945   Cancelled Treatment:    Reason Eval/Treat Not Completed: Patient at procedure or test/unavailable (Chart reviewed for attempted evaluation.  Patient currently off unit for testing.  Will re-attempt at later time as patient available and medically appropriate.)  Taytem Ghattas H. Owens Shark, PT, DPT, NCS 05/11/2015, 8:36 AM [phone]

## 2015-05-11 NOTE — Progress Notes (Signed)
*  PRELIMINARY RESULTS* Echocardiogram 2D Echocardiogram has been performed.  Chloe Arnold 05/11/2015, 8:54 AM

## 2015-05-11 NOTE — Progress Notes (Signed)
69fu pump disconnected from right portacath for MRI per orders, port flushed with NS, excellent blood return noted.  Will reconnect after MRI completed
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
## 2015-05-11 NOTE — Progress Notes (Signed)
PT Cancellation Note  Patient Details Name: Chloe Arnold MRN: [id number] DOB: 1946-05-17   Cancelled Treatment:    Reason Eval/Treat Not Completed: Patient at procedure or test/unavailable (Evaluation re-attempted.  Patient preparing to leave unit for MRI.  Will re-attempt at later time/date as medically appropriate.)   Deniss Wormley H. Owens Shark, PT, DPT, NCS 05/11/2015, 1:30 PM [phone]

## 2015-05-11 NOTE — Consult Note (Signed)
Reason for Consult: syncope Referring Physician: Dr. Patel  Chloe Arnold is an 69 y.o. female.  HPI: 69 yo RHD F presents to [facility] with dizziness.  Of note she does note that she cant see on the R side either.  She was just admitted with similar dizziness with normal cardiac w/u.  She is under treatment for breast cancer with chemotherapy.  Past Medical History  Diagnosis Date  . Hypertension   . Coronary artery disease     angioplasty  . Indigestion   . Nausea   . Coughing   . Cancer ([place])     breast  . Cancer of stomach ([place])   . Shortness of breath dyspnea     Past Surgical History  Procedure Laterality Date  . Breast surgery    . Abdominal hysterectomy    . Goiter removed Bilateral   . Joint replacement    . Knee arthroplasty Left   . Portacath placement Right 03/31/2015    Procedure: INSERTION PORT-A-CATH;  Surgeon: Leonie Green, MD;  Location: [facility] ORS;  Service: General;  Laterality: Right;    History reviewed. No pertinent family history.  Social History:  reports that she has never smoked. She does not have any smokeless tobacco history on file. She reports that she does not drink alcohol or use illicit drugs.  Allergies: No Known Allergies  Medications: personally reviewed by me  Results for orders placed or performed during the hospital encounter of 05/10/15 (from the past 48 hour(s))  CBC     Status: Abnormal   Collection Time: 05/10/15  1:58 PM  Result Value Ref Range   WBC 58.8 (HH) 3.6 - 11.0 K/uL    Comment: CRITICAL RESULT CALLED TO, READ BACK BY AND VERIFIED WITH: Leonel Ramsay @ 1422 05/10/15 by [place]    RBC 3.31 (L) 3.80 - 5.20 MIL/uL   Hemoglobin 9.5 (L) 12.0 - 16.0 g/dL   HCT 31.2 (L) 35.0 - 47.0 %   MCV 94.2 80.0 - 100.0 fL   MCH 28.7 26.0 - 34.0 pg   MCHC 30.5 (L) 32.0 - 36.0 g/dL   RDW 25.8 (H) 11.5 - 14.5 %   Platelets 397 150 - 440 K/uL  Basic metabolic panel     Status: Abnormal   Collection Time: 05/10/15  1:58 PM  Result  Value Ref Range   Sodium 131 (L) 135 - 145 mmol/L   Potassium 5.0 3.5 - 5.1 mmol/L   Chloride 99 (L) 101 - 111 mmol/L   CO2 22 22 - 32 mmol/L   Glucose, Bld 193 (H) 65 - 99 mg/dL   BUN 22 (H) 6 - 20 mg/dL   Creatinine, Ser 0.70 0.44 - 1.00 mg/dL   Calcium 8.3 (L) 8.9 - 10.3 mg/dL   GFR calc non Af Amer >60 >60 mL/min   GFR calc Af Amer >60 >60 mL/min    Comment: (NOTE) The eGFR has been calculated using the CKD EPI equation. This calculation has not been validated in all clinical situations. eGFR's persistently <60 mL/min signify possible Chronic Kidney Disease.    Anion gap 10 5 - 15  Troponin I     Status: Abnormal   Collection Time: 05/10/15  1:58 PM  Result Value Ref Range   Troponin I 0.10 (H) <0.031 ng/mL    Comment: READ BACK AND VERIFIED WITH AMANDA LOVETT ON 05/10/15 AT 1450 [place]        PERSISTENTLY INCREASED TROPONIN VALUES IN THE RANGE OF 0.04-0.49 ng/mL  CAN BE SEEN IN:       -UNSTABLE ANGINA       -CONGESTIVE HEART FAILURE       -MYOCARDITIS       -CHEST TRAUMA       -ARRYHTHMIAS       -LATE PRESENTING MYOCARDIAL INFARCTION       -COPD   CLINICAL FOLLOW-UP RECOMMENDED.   Glucose, capillary     Status: Abnormal   Collection Time: 05/10/15  1:59 PM  Result Value Ref Range   Glucose-Capillary 180 (H) 65 - 99 mg/dL  Blood culture (routine x 2)     Status: None (Preliminary result)   Collection Time: 05/10/15  3:21 PM  Result Value Ref Range   Specimen Description BLOOD LEFT ASSIST CONTROL    Special Requests BOTTLES DRAWN AEROBIC AND ANAEROBIC 5CC    Culture NO GROWTH < 24 HOURS    Report Status PENDING   Blood culture (routine x 2)     Status: None (Preliminary result)   Collection Time: 05/10/15  3:21 PM  Result Value Ref Range   Specimen Description BLOOD LEFT WRIST    Special Requests BOTTLES DRAWN AEROBIC AND ANAEROBIC 4CC    Culture NO GROWTH < 24 HOURS    Report Status PENDING   Troponin I (q 6hr x 3)     Status: Abnormal   Collection Time:  05/10/15  5:18 PM  Result Value Ref Range   Troponin I 0.11 (H) <0.031 ng/mL    Comment: RESULTS PREVIOUSLY CALLED AT  1450 05/10/15 BY [place].Marland KitchenMarland KitchenSDR        PERSISTENTLY INCREASED TROPONIN VALUES IN THE RANGE OF 0.04-0.49 ng/mL CAN BE SEEN IN:       -UNSTABLE ANGINA       -CONGESTIVE HEART FAILURE       -MYOCARDITIS       -CHEST TRAUMA       -ARRYHTHMIAS       -LATE PRESENTING MYOCARDIAL INFARCTION       -COPD   CLINICAL FOLLOW-UP RECOMMENDED.   Troponin I (q 6hr x 3)     Status: Abnormal   Collection Time: 05/10/15 10:04 PM  Result Value Ref Range   Troponin I 0.07 (H) <0.031 ng/mL    Comment: RESULT PREVIOULY NOTIFIED ON 05/10/15 _0  BY SDR.Marland KitchenMarland KitchenAJO  Troponin I (q 6hr x 3)     Status: Abnormal   Collection Time: 05/11/15  4:35 AM  Result Value Ref Range   Troponin I 0.25 (H) <0.031 ng/mL    Comment: RESULTS PREVIOUSLY CALLED 05/10/15 _1  BY SDR.Marland KitchenMarland KitchenAJO        PERSISTENTLY INCREASED TROPONIN VALUES IN THE RANGE OF 0.04-0.49 ng/mL CAN BE SEEN IN:       -UNSTABLE ANGINA       -CONGESTIVE HEART FAILURE       -MYOCARDITIS       -CHEST TRAUMA       -ARRYHTHMIAS       -LATE PRESENTING MYOCARDIAL INFARCTION       -COPD   CLINICAL FOLLOW-UP RECOMMENDED.   Lipid panel     Status: Abnormal   Collection Time: 05/11/15  4:35 AM  Result Value Ref Range   Cholesterol 203 (H) 0 - 200 mg/dL   Triglycerides 139 <150 mg/dL   HDL 11 (L) >40 mg/dL   Total CHOL/HDL Ratio 18.5 RATIO   VLDL 28 0 - 40 mg/dL   LDL Cholesterol 164 (H) 0 - 99 mg/dL    Comment:  Total Cholesterol/HDL:CHD Risk Coronary Heart Disease Risk Table                     Men   Women  1/2 Average Risk   3.4   3.3  Average Risk       5.0   4.4  2 X Average Risk   9.6   7.1  3 X Average Risk  23.4   11.0        Use the calculated Patient Ratio above and the CHD Risk Table to determine the patient's CHD Risk.        ATP III CLASSIFICATION (LDL):  <100     mg/dL   Optimal  100-129  mg/dL   Near or Above                     Optimal  130-159  mg/dL   Borderline  160-189  mg/dL   High  >190     mg/dL   Very High   Urinalysis complete, with microscopic ([facility] only)     Status: Abnormal   Collection Time: 05/11/15  5:09 AM  Result Value Ref Range   Color, Urine YELLOW (A) YELLOW   APPearance CLEAR (A) CLEAR   Glucose, UA NEGATIVE NEGATIVE mg/dL   Bilirubin Urine NEGATIVE NEGATIVE   Ketones, ur NEGATIVE NEGATIVE mg/dL   Specific Gravity, Urine 1.055 (H) 1.005 - 1.030   Hgb urine dipstick NEGATIVE NEGATIVE   pH 5.0 5.0 - 8.0   Protein, ur NEGATIVE NEGATIVE mg/dL   Nitrite NEGATIVE NEGATIVE   Leukocytes, UA NEGATIVE NEGATIVE   RBC / HPF 0-5 0 - 5 RBC/hpf   WBC, UA 0-5 0 - 5 WBC/hpf   Bacteria, UA RARE (A) NONE SEEN   Squamous Epithelial / LPF 0-5 (A) NONE SEEN   Mucous PRESENT     Dg Chest 2 View  05/10/2015  CLINICAL DATA:  Dizziness, near syncope EXAM: CHEST  2 VIEW COMPARISON:  03/31/2015 FINDINGS: Heart size and vascular pattern normal. Stable Port-A-Cath on the right. Left lung is clear. On the right there is a new small to moderate effusion with underlying compressive atelectasis. IMPRESSION: New right pleural effusion Electronically Signed   By: Skipper Cliche M.D.   On: 05/10/2015 14:23   Ct Head W Wo Contrast  05/10/2015  CLINICAL DATA:  69 year old hypertensive female with stomach cancer on chemotherapy presenting with episode of dizziness with leg weakness. Subsequent encounter. EXAM: CT HEAD WITHOUT AND WITH CONTRAST TECHNIQUE: Contiguous axial images were obtained from the base of the skull through the vertex without and with intravenous contrast CONTRAST:  31m OMNIPAQUE IOHEXOL 350 MG/ML SOLN COMPARISON:  05/06/2015 head CT. FINDINGS: No CT evidence of large acute infarct. No skull fracture or intracranial hemorrhage. No osseous destructive lesion or abnormal intracranial enhancing lesion to suggest presence of intracranial metastatic disease. Mild atrophy without hydrocephalus.  Vascular calcifications. Orbital structures unremarkable. Mastoid air cells, middle ear cavities and visualized paranasal sinuses are clear. IMPRESSION: No CT evidence of large acute infarct. No skull fracture or intracranial hemorrhage. No osseous destructive lesion or abnormal intracranial enhancing lesion to suggest presence of intracranial metastatic disease. Electronically Signed   By: SGenia DelM.D.   On: 05/10/2015 14:43   [place]otid Bilateral  05/11/2015  CLINICAL DATA:  Syncope. EXAM: BILATERAL CAROTID DUPLEX ULTRASOUND TECHNIQUE: GPearline Cablesscale imaging, color Doppler and duplex ultrasound were performed of bilateral carotid and vertebral arteries in the neck. COMPARISON:  CT 04/19/2015. FINDINGS: Criteria: Quantification of carotid stenosis is based on velocity parameters that correlate the residual internal carotid diameter with NASCET-based stenosis levels, using the diameter of the distal internal carotid lumen as the denominator for stenosis measurement. The following velocity measurements were obtained: RIGHT ICA:  80/26 cm/sec CCA:  74/94 cm/sec SYSTOLIC ICA/CCA RATIO:  1.0 DIASTOLIC ICA/CCA RATIO:  1.3 ECA:  106 cm/sec LEFT ICA:  221/49 cm/sec CCA:  49/67 cm/sec SYSTOLIC ICA/CCA RATIO:  3.0 DIASTOLIC ICA/CCA RATIO:  3.1 ECA:  146 cm/sec RIGHT CAROTID ARTERY: Mild plaque with calcification of the right carotid bifurcation. No flow limiting stenosis. RIGHT VERTEBRAL ARTERY:  Patent with antegrade flow. LEFT CAROTID ARTERY: Moderate plaque with calcification noted about the left carotid bifurcation. Elevated flow velocity and velocity ratios. Degree of stenosis in the 50-69% range. LEFT VERTEBRAL ARTERY:  Patent with antegrade flow. IMPRESSION: 1. 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 2. Mild plaque right carotid bifurcation. No flow limiting stenosis. Degree of stenosis less than 50%. 3. Vertebral artery is a patent antegrade flow . Electronically Signed    By: Marcello Moores  Register   On: 05/11/2015 11:33    Review of Systems  Constitutional: Positive for malaise/fatigue. Negative for fever, chills, weight loss and diaphoresis.  HENT: Negative for congestion, ear discharge, ear pain, hearing loss, nosebleeds, sore throat and tinnitus.   Eyes: Negative.   Respiratory: Negative.  Negative for stridor.   Cardiovascular: Negative.   Gastrointestinal: Negative.   Genitourinary: Negative.   Musculoskeletal: Negative.   Skin: Negative.   Neurological: Positive for dizziness, focal weakness, loss of consciousness, weakness and headaches. Negative for tingling, tremors, sensory change, speech change and seizures.  Psychiatric/Behavioral: Negative.    Blood pressure 131/61, pulse 91, temperature 98.2 F (36.8 C), temperature source Oral, resp. rate 16, height _0  (1.575 m), weight 81.602 kg (179 lb 14.4 oz), SpO2 94 %. Physical Exam  Nursing note and vitals reviewed. Constitutional: She appears well-developed and well-nourished. She appears distressed.  HENT:  Head: Normocephalic and atraumatic.  Right Ear: External ear normal.  Left Ear: External ear normal.  Nose: Nose normal.  Mouth/Throat: Oropharynx is clear and moist.  Eyes: Conjunctivae and EOM are normal. Pupils are equal, round, and reactive to light. No scleral icterus.  Neck: Normal range of motion. Neck supple.  Cardiovascular: Normal rate, regular rhythm, normal heart sounds and intact distal pulses.   Respiratory: Effort normal and breath sounds normal. She has no wheezes.  GI: Soft. Bowel sounds are normal.  Musculoskeletal: Normal range of motion. She exhibits no edema.  Neurological: She is alert.  Alert and oriented x 2 not time, mild dysarthria, nl language PERRLA, EOMI, L homonymous hemianopsia, face symmetric, tongue midline Drift on R, nl 5/5 on L, nl tone FTN WNL 1+/4 B, mute plantars No neglect, nl temp and pin  Skin: Skin is warm. She is not diaphoretic.   CT of  head personally reviewed by me and shows mild white matter changes  EEG normal  Assessment/Plan: 1.  Probable stroke-  Pt still has visual cut and some R sided weakness but this can be seen with a seizure as well;  This could also be a brain metastasis from cancer -  MRI pending -  Echo pending -  ASA 68m daily for now -  Will follow results -  Needs therapy consults  Audon Heymann 05/11/2015, 12:51 PM

## 2015-05-11 NOTE — Progress Notes (Signed)
52fu pump reconnected per orders in right portacath, + blood return noted, flushed with ease, pump restarted.
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
## 2015-05-12 ENCOUNTER — Observation Stay: Payer: Medicare Other

## 2015-05-12 DIAGNOSIS — I6522 Occlusion and stenosis of left carotid artery: Secondary | ICD-10-CM | POA: Diagnosis present

## 2015-05-12 DIAGNOSIS — Z79899 Other long term (current) drug therapy: Secondary | ICD-10-CM | POA: Diagnosis not present

## 2015-05-12 DIAGNOSIS — E782 Mixed hyperlipidemia: Secondary | ICD-10-CM | POA: Diagnosis present

## 2015-05-12 DIAGNOSIS — I639 Cerebral infarction, unspecified: Secondary | ICD-10-CM | POA: Diagnosis present

## 2015-05-12 DIAGNOSIS — R748 Abnormal levels of other serum enzymes: Secondary | ICD-10-CM | POA: Diagnosis present

## 2015-05-12 DIAGNOSIS — R000 Tachycardia, unspecified: Secondary | ICD-10-CM | POA: Diagnosis present

## 2015-05-12 DIAGNOSIS — Z96652 Presence of left artificial knee joint: Secondary | ICD-10-CM | POA: Diagnosis present

## 2015-05-12 DIAGNOSIS — I63432 Cerebral infarction due to embolism of left posterior cerebral artery: Secondary | ICD-10-CM | POA: Diagnosis not present

## 2015-05-12 DIAGNOSIS — I2510 Atherosclerotic heart disease of native coronary artery without angina pectoris: Secondary | ICD-10-CM | POA: Diagnosis present

## 2015-05-12 DIAGNOSIS — C787 Secondary malignant neoplasm of liver and intrahepatic bile duct: Secondary | ICD-10-CM | POA: Diagnosis present

## 2015-05-12 DIAGNOSIS — C50919 Malignant neoplasm of unspecified site of unspecified female breast: Secondary | ICD-10-CM | POA: Diagnosis present

## 2015-05-12 DIAGNOSIS — Z85028 Personal history of other malignant neoplasm of stomach: Secondary | ICD-10-CM | POA: Diagnosis not present

## 2015-05-12 DIAGNOSIS — Z90710 Acquired absence of both cervix and uterus: Secondary | ICD-10-CM | POA: Diagnosis not present

## 2015-05-12 DIAGNOSIS — R55 Syncope and collapse: Secondary | ICD-10-CM | POA: Diagnosis present

## 2015-05-12 DIAGNOSIS — C169 Malignant neoplasm of stomach, unspecified: Secondary | ICD-10-CM | POA: Diagnosis present

## 2015-05-12 DIAGNOSIS — J90 Pleural effusion, not elsewhere classified: Secondary | ICD-10-CM | POA: Diagnosis present

## 2015-05-12 DIAGNOSIS — I10 Essential (primary) hypertension: Secondary | ICD-10-CM | POA: Diagnosis present

## 2015-05-12 DIAGNOSIS — Z7982 Long term (current) use of aspirin: Secondary | ICD-10-CM | POA: Diagnosis not present

## 2015-05-12 MED ORDER — SODIUM CHLORIDE 0.9 % IJ SOLN
INTRAMUSCULAR | Status: AC
  Administered 2015-05-12: 10 mL
  Filled 2015-05-12: qty 10

## 2015-05-12 MED ORDER — GADOBENATE DIMEGLUMINE 529 MG/ML IV SOLN
16.0000 mL | Freq: Once | INTRAVENOUS | Status: AC | PRN
  Administered 2015-05-12: 16 mL via INTRAVENOUS

## 2015-05-12 MED ORDER — LORAZEPAM 2 MG/ML IJ SOLN
1.0000 mg | Freq: Once | INTRAMUSCULAR | Status: AC
  Administered 2015-05-12: 1 mg via INTRAVENOUS
  Filled 2015-05-12: qty 1

## 2015-05-12 NOTE — Clinical Social Work Placement (Addendum)
   CLINICAL SOCIAL WORK PLACEMENT  NOTE  Date:  05/12/2015  Patient Details  Name: Chloe Arnold MRN: [id number] Date of Birth: March 12, 1946  Clinical Social Work is seeking post-discharge placement for this patient at the [place] level of care (*CSW will initial, date and re-position this form in  chart as items are completed):  Yes   Patient/family provided with [place] Work Department's list of facilities offering this level of care within the geographic area requested by the patient (or if unable, by the patient's family).  Yes   Patient/family informed of their freedom to choose among providers that offer the needed level of care, that participate in Medicare, Medicaid or managed care program needed by the patient, have an available bed and are willing to accept the patient.  Yes   Patient/family informed of [facility]'s ownership interest in [facility] and [facility], as well as of the fact that they are under no obligation to receive care at these facilities.  PASRR submitted to EDS on 05/12/15     PASRR number received on 05/12/15     Existing PASRR number confirmed on       FL2 transmitted to all facilities in geographic area requested by pt/family on 05/12/15     FL2 transmitted to all facilities within larger geographic area on       Patient informed that his/her managed care company has contracts with or will negotiate with certain facilities, including the following:         05/13/15   Patient/family informed of bed offers received.  Patient chooses bed at   [facility]    Physician recommends and patient chooses bed at      Patient to be transferred to   [facility] on  .05/14/15  Patient to be transferred to facility by   EMS    Patient family notified on  05/14/15 of transfer.  Name of family member notified:    Niece Hoyle [place] and Daughter    PHYSICIAN       Additional Comment:     _______________________________________________ Darden Dates, LCSW 05/12/2015, 5:24 PM

## 2015-05-12 NOTE — Progress Notes (Signed)
MRI results called to Dr. Posey Pronto.

## 2015-05-12 NOTE — Progress Notes (Signed)
Rehab Admissions Coordinator Note:  Patient was screened by Cleatrice Burke for appropriateness for an Inpatient Acute Rehab Consult per PT recommendation.  At this time, we are recommending an inpt rehab consult order for onsite assessment for potential rehab admission. I will follow up in the morning.   Cleatrice Burke 05/12/2015, 8:39 PM  I can be reached at [phone].

## 2015-05-12 NOTE — Progress Notes (Signed)
Patient resting with family at bedside.  VSS. Alert and oriented.  No complaints of pain today.

## 2015-05-12 NOTE — Patient Care Conference (Signed)
Patient has ruled in for CVA and is now inpatient.  Have updated Zigmund Daniel with Acute inpatient rehab.

## 2015-05-12 NOTE — Care Management (Signed)
Patient refused MRI. PT has recommended acute inpatient rehab.  Spoke with patient and her daughter.  Patient is now agreeable to MRI if can receive some sedation.  Daughter is very much aware that patient can not return home in present state without round the clock supervision.  Heart rate was up to 130 this day with exertion

## 2015-05-12 NOTE — Progress Notes (Addendum)
47fu pump disconnected port flushed with 10 cc ns and excellent blood return noted. Will reconnect after MRI 1450 pump restarted per orders, site clear, port noted with good blood return, pt without c/o
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
## 2015-05-12 NOTE — NC FL2 (Signed)
  [place] LEVEL OF CARE SCREENING TOOL     IDENTIFICATION  Patient Name: Chloe Arnold Birthdate: August 13, 1945 Sex: female Admission Date (Current Location): 05/10/2015  [place] and [place] Number: Educational psychologist and Address:  [facility], [address] [place], [place] [address]      Provider Number: [id number]  Attending Physician Name and Address:  Fritzi Mandes, MD  Relative Name and Phone Number:       Current Level of Care: Hospital Recommended Level of Care: [place] Prior Approval Number:    Date Approved/Denied:   PASRR Number: [id number]  Discharge Plan: Home    Current Diagnoses: Patient Active Problem List   Diagnosis Date Noted  . CVA (cerebral infarction) 05/12/2015  . Syncope 05/06/2015  . Gastric cancer ([place]) 04/01/2015    Orientation ACTIVITIES/SOCIAL BLADDER RESPIRATION    Self, Time, Situation  Active Continent Normal  BEHAVIORAL SYMPTOMS/MOOD NEUROLOGICAL BOWEL NUTRITION STATUS      Continent Diet (Heart Healthy/Thin Liquids)  PHYSICIAN VISITS COMMUNICATION OF NEEDS Height & Weight Skin  30 days Verbally 5\' 2"  (157.5 cm) 179 lbs. Normal          AMBULATORY STATUS RESPIRATION    Supervision limited Normal      Personal Care Assistance Level of Assistance  Bathing, Feeding, Dressing Bathing Assistance: Limited assistance Feeding assistance: Limited assistance Dressing Assistance: Limited assistance      Functional Limitations [place]  PT (By licensed PT), OT (By licensed OT), Speech therapy     PT Frequency: 5 OT Frequency: 5     Speech Therapy Frequency: 5     Additional Factors Info  Code Status, Allergies Code Status Info: Full Code Allergies Info: No known allergies           Current Medications (05/12/2015):  This is the current hospital active medication list Current Facility-Administered Medications   Medication Dose Route Frequency Provider Last Rate Last Dose  .  stroke: mapping our early stages of recovery book   Does not apply Once Nicholes Mango, MD      . aspirin suppository 300 mg  300 mg Rectal Daily Nicholes Mango, MD       Or  . aspirin tablet 325 mg  325 mg Oral Daily Nicholes Mango, MD   325 mg at 05/12/15 0923  . atorvastatin (LIPITOR) tablet 40 mg  40 mg Oral q1800 Fritzi Mandes, MD   40 mg at 05/11/15 1818  . enoxaparin (LOVENOX) injection 40 mg  40 mg Subcutaneous Q24H Nicholes Mango, MD   40 mg at 05/11/15 2019  . HYDROcodone-acetaminophen (NORCO) 10-325 MG per tablet 1 tablet  1 tablet Oral Q6H PRN Fritzi Mandes, MD      . pantoprazole (PROTONIX) EC tablet 40 mg  40 mg Oral Daily Fritzi Mandes, MD   40 mg at 05/12/15 0923  . prochlorperazine (COMPAZINE) tablet 10 mg  10 mg Oral Q6H PRN Fritzi Mandes, MD         Discharge Medications: Please see discharge summary for a list of discharge medications.  Relevant Imaging Results:  Relevant Lab Results:  Recent Labs    Additional Information    Darden Dates, LCSW
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
## 2015-05-12 NOTE — Evaluation (Addendum)
Physical Therapy Evaluation Patient Details Name: Chloe Arnold MRN: [id number] DOB: 09-06-1945 Today's Date: 05/12/2015   History of Present Illness  presented to ER secondary to dizziness, blurred vision; admitted for syncope vs. CVA.  Of note, patient with recent admission for dizziness, elevated troponin; ruled out for ACS.  Of note, patient actively receiving chemotherapy for breast cancer (via R chest port)  Clinical Impression  Upon evaluation, patient alert and oriented to basic information; follows simple commands, though demonstrates noted delay in processing.  Mod expressive aphasia evident, both with confrontational naming and fluent conversation; beginning to employ some compensatory strategies.  R UE/LE with fair strength, 4/5, but mild/mod delay in coordination.  Sensation and awareness of body position intact throughout.  Noted visual field deficit on R with marked inattention to R hemi-body during functional activities.  Currently requiring min/mod assist for sit/stand, basic transfers and gait (15') with RW; constant cuing for R visual scanning and obstacle negotiation.  HR elevation to 140 with simple activity; returns to baseline with seated rest period (RN informed/aware).  Unsafe to return home alone at this time. Would benefit from skilled PT to address above deficits and promote optimal return to PLOF; recommend transition to acute inpatient rehab upon discharge for high-intensity, post-acute rehab services (if family agreeable and patient able to continue CA treatment).  If not, will benefit from STR--patient not safe to be home alone upon discharge due to above deficits.      Follow Up Recommendations SNF;CIR (if family agreeable and patient able to continue CA treatment while in rehab)    Equipment Recommendations  Rolling walker with 5" wheels    Recommendations for Other Services       Precautions / Restrictions Precautions Precautions: Fall Precaution Comments: R  chest port Restrictions Weight Bearing Restrictions: No      Mobility  Bed Mobility Overal bed mobility: Modified Independent Bed Mobility: Supine to Sit     Supine to sit: Modified independent (Device/Increase time)        Transfers Overall transfer level: Needs assistance Equipment used: Rolling walker (2 wheeled) Transfers: Sit to/from Stand Sit to Stand: Min assist         General transfer comment: cuing for hand placement and R UE position  Ambulation/Gait Ambulation/Gait assistance: Min assist;Mod assist Ambulation Distance (Feet): 40 Feet Assistive device: Rolling walker (2 wheeled)     Gait velocity interpretation: <1.8 ft/sec, indicative of risk for recurrent falls General Gait Details: step to gait pattern with decreased stance time R LE; constant verbal cuing for R visual scanning and obstacle negotiation (difficulty discriminating R vs. L with functional activity).  Sudden onset of fatigue, weakness after 40', HR noted at 139; recovers to 110s with seated rest  Stairs            Wheelchair Mobility    Modified Rankin (Stroke Patients Only)       Balance Overall balance assessment: Needs assistance Sitting-balance support: No upper extremity supported;Feet supported Sitting balance-Leahy Scale: Good     Standing balance support: Bilateral upper extremity supported Standing balance-Leahy Scale: Fair                               Pertinent Vitals/Pain Pain Assessment: No/denies pain    Home Living Family/patient expects to be discharged to:: Private residence Living Arrangements: Children Available Help at Discharge: Family (daughter works outside of home; not available for 24 hour sup/assist) Type  of Home: House Home Access: Stairs to enter Entrance Stairs-Rails: None Entrance Stairs-Number of Steps: 1 Home Layout: One level        Prior Function Level of Independence: Independent         Comments: Indep with  household mobility and ADLs; does endorse single fall in recent weeks     Hand Dominance   Dominant Hand: Right    Extremity/Trunk Assessment   Upper Extremity Assessment:  (R UE grossly 4/5, mild delay in speed of activation and coordination, mild ataxia; sensation grossly WFL, no extinction noted)           Lower Extremity Assessment:  (R LE grossly 4+/5 throughout, mild delay in activation; sensation grossly WFL, no extinction, intact proprioception)         Communication   Communication: Expressive difficulties (expressive aphasia, noted with confrontational naming and in conversation)  Cognition Arousal/Alertness: Awake/alert Behavior During Therapy: WFL for tasks assessed/performed (delayed processing) Overall Cognitive Status: Within Functional Limits for tasks assessed                      General Comments      Exercises Other Exercises Other Exercises: Visual scanning: R visual field deficit to approx 20 degrees (r) past midline with marked inattention to R hemi-body.  Beginning to employ some compensatory strategies with cuing from therapist. Other Exercises: Additional gait trial x40', deficits as noted above. Other Exercises: Educated on aphasia--presentation, compensatory strategies and rehab techniques; patient/family voiced understanding, but could benefit from reinforcement.      Assessment/Plan    PT Assessment Patient needs continued PT services  PT Diagnosis Difficulty walking;Generalized weakness;Hemiplegia dominant side   PT Problem List Decreased strength;Decreased range of motion;Decreased activity tolerance;Decreased balance;Decreased mobility;Decreased cognition;Decreased knowledge of use of DME;Decreased safety awareness;Decreased knowledge of precautions;Cardiopulmonary status limiting activity  PT Treatment Interventions DME instruction;Gait training;Stair training;Functional mobility training;Therapeutic activities;Therapeutic  exercise;Balance training;Neuromuscular re-education;Cognitive remediation;Patient/family education   PT Goals (Current goals can be found in the Care Plan section) Acute Rehab PT Goals Patient Stated Goal: "to get better" PT Goal Formulation: With patient/family Time For Goal Achievement: 05/26/15 Potential to Achieve Goals: Good    Frequency 7X/week   Barriers to discharge Decreased caregiver support      Co-evaluation               End of Session Equipment Utilized During Treatment: Gait belt Activity Tolerance: Patient tolerated treatment well Patient left: in bed;with call bell/phone within reach;with bed alarm set;with family/visitor present Nurse Communication: Mobility status    Functional Assessment Tool Used: clinical judgement Functional Limitation: Mobility: Walking and moving around Mobility: Walking and Moving Around Current Status [id number]): At least 60 percent but less than 80 percent impaired, limited or restricted Mobility: Walking and Moving Around Goal Status [id number]): At least 20 percent but less than 40 percent impaired, limited or restricted    Time: 0924-1002 PT Time Calculation (min) (ACUTE ONLY): 38 min   Charges:   PT Evaluation $Initial PT Evaluation Tier I: 1 Procedure PT Treatments $Gait Training: 8-22 mins $Neuromuscular Re-education: 8-22 mins   PT G Codes:   PT G-Codes **NOT FOR INPATIENT CLASS** Functional Assessment Tool Used: clinical judgement Functional Limitation: Mobility: Walking and moving around Mobility: Walking and Moving Around Current Status [id number]): At least 60 percent but less than 80 percent impaired, limited or restricted Mobility: Walking and Moving Around Goal Status [phone]): At least 20 percent but less than 40 percent impaired, limited or restricted  Damel Querry H. Owens Shark, PT, DPT, NCS 05/12/2015, 10:47 AM [phone]

## 2015-05-12 NOTE — Progress Notes (Signed)
Neurology:  Pt doing better, but MRI shows L small vessel posterior circulation.  Pt is on anti platelet therapy. Strokes likely in setting of hypercoagulable state in setting of malignancy.    Will obtain CTA head tomorrow to look at basilar and PCAs   Chloe Arnold, Alease Frame
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
## 2015-05-12 NOTE — Clinical Social Work Note (Signed)
Clinical Social Work Assessment  Patient Details  Name: Chloe Arnold MRN: [id number] Date of Birth: 14-Jul-1945  Date of referral:  05/12/15               Reason for consult:  Facility Placement                Permission sought to share information with:  Family Supports Permission granted to share information::  Yes, Verbal Permission Granted  Name::     friend   Housing/Transportation Living arrangements for the past 2 months:  Single Family Home Source of Information:  Patient Patient Interpreter Needed:  None Criminal Activity/Legal Involvement Pertinent to Current Situation/Hospitalization:  No - Comment as needed Significant Relationships:  Adult Children, Friend Lives with:  Self Do you feel safe going back to the place where you live?  Yes Need for family participation in patient care:  No (Coment)  Care giving concerns:  No care giving concerns identified.   Social Worker assessment / plan:  CSW met with pt and friend to address consult for New SNF. CSW introduced herself and explained role of social work. CSW also explained the process of discharging to SNF for STR. Pt lives alone and has supportive family and friends. Pt is also under going cancer treatment, which will be on hold during rehab. CSW intiated SNF search and will follow up with bed offers.   Employment status:  Retired Nurse, adult PT Recommendations:  [place] / Referral to community resources:  [place]  Patient/Family's Response to care:  Pt was appreciative of CSW support.   Patient/Family's Understanding of and Emotional Response to Diagnosis, Current Treatment, and Prognosis:  Pt understands that she needs rehab prior to returning home.   Emotional Assessment Appearance:  Appears older than stated age Attitude/Demeanor/Rapport:   (Appropriate) Affect (typically observed):  Pleasant Orientation:  Oriented to Self, Oriented to  Place, Oriented to  Time Alcohol / Substance use:  Never Used Psych involvement (Current and /or in the community):  No (Comment)  Discharge Needs  Concerns to be addressed:  No discharge needs identified Readmission within the last 30 days:  Yes Current discharge risk:  None Barriers to Discharge:  No Barriers Identified   Darden Dates, LCSW 05/12/2015, 5:21 PM

## 2015-05-12 NOTE — Progress Notes (Signed)
Patient ID: Chloe Arnold, female   DOB: 1945-07-25, 69 y.o.   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

## 2015-05-12 NOTE — Progress Notes (Signed)
RN spoke with CT regarding the need for 20G IV above the wrist. House supervisor evaluated the pt's veins and was unsuccessful in placing IV. Dr Lavetta Nielsen notified and he said to postpone test indefinitely. Ct notified as well.

## 2015-05-13 ENCOUNTER — Inpatient Hospital Stay: Payer: Medicare Other | Attending: Oncology

## 2015-05-13 DIAGNOSIS — Z79899 Other long term (current) drug therapy: Secondary | ICD-10-CM | POA: Insufficient documentation

## 2015-05-13 DIAGNOSIS — I69398 Other sequelae of cerebral infarction: Secondary | ICD-10-CM | POA: Insufficient documentation

## 2015-05-13 DIAGNOSIS — D649 Anemia, unspecified: Secondary | ICD-10-CM | POA: Insufficient documentation

## 2015-05-13 DIAGNOSIS — I2510 Atherosclerotic heart disease of native coronary artery without angina pectoris: Secondary | ICD-10-CM | POA: Insufficient documentation

## 2015-05-13 DIAGNOSIS — E785 Hyperlipidemia, unspecified: Secondary | ICD-10-CM | POA: Insufficient documentation

## 2015-05-13 DIAGNOSIS — R05 Cough: Secondary | ICD-10-CM | POA: Insufficient documentation

## 2015-05-13 DIAGNOSIS — K219 Gastro-esophageal reflux disease without esophagitis: Secondary | ICD-10-CM | POA: Insufficient documentation

## 2015-05-13 DIAGNOSIS — C787 Secondary malignant neoplasm of liver and intrahepatic bile duct: Secondary | ICD-10-CM | POA: Insufficient documentation

## 2015-05-13 DIAGNOSIS — H538 Other visual disturbances: Secondary | ICD-10-CM | POA: Insufficient documentation

## 2015-05-13 DIAGNOSIS — Z853 Personal history of malignant neoplasm of breast: Secondary | ICD-10-CM | POA: Insufficient documentation

## 2015-05-13 DIAGNOSIS — D72829 Elevated white blood cell count, unspecified: Secondary | ICD-10-CM | POA: Insufficient documentation

## 2015-05-13 DIAGNOSIS — Z803 Family history of malignant neoplasm of breast: Secondary | ICD-10-CM | POA: Insufficient documentation

## 2015-05-13 DIAGNOSIS — Z9223 Personal history of estrogen therapy: Secondary | ICD-10-CM | POA: Insufficient documentation

## 2015-05-13 DIAGNOSIS — R531 Weakness: Secondary | ICD-10-CM | POA: Insufficient documentation

## 2015-05-13 DIAGNOSIS — Z7982 Long term (current) use of aspirin: Secondary | ICD-10-CM | POA: Insufficient documentation

## 2015-05-13 DIAGNOSIS — C169 Malignant neoplasm of stomach, unspecified: Secondary | ICD-10-CM | POA: Insufficient documentation

## 2015-05-13 DIAGNOSIS — I10 Essential (primary) hypertension: Secondary | ICD-10-CM | POA: Insufficient documentation

## 2015-05-13 LAB — CREATININE, SERUM: CREATININE: 0.77 mg/dL (ref 0.44–1.00)

## 2015-05-13 MED ORDER — METOPROLOL TARTRATE 25 MG PO TABS: 25.0000 mg | ORAL_TABLET | Freq: Two times a day (BID) | ORAL | Status: AC

## 2015-05-13 MED ORDER — ONDANSETRON HCL 4 MG/2ML IJ SOLN
INTRAMUSCULAR | Status: AC
  Administered 2015-05-13: 4 mg
  Filled 2015-05-13: qty 2

## 2015-05-13 MED ORDER — METOPROLOL TARTRATE 25 MG PO TABS
25.0000 mg | ORAL_TABLET | Freq: Two times a day (BID) | ORAL | Status: DC
  Administered 2015-05-13 – 2015-05-14 (×3): 25 mg via ORAL
  Filled 2015-05-13 (×3): qty 1

## 2015-05-13 MED ORDER — BISACODYL 10 MG RE SUPP
10.0000 mg | Freq: Once | RECTAL | Status: AC
  Administered 2015-05-13: 10 mg via RECTAL
  Filled 2015-05-13: qty 1

## 2015-05-13 MED ORDER — ATORVASTATIN CALCIUM 40 MG PO TABS: 40.0000 mg | ORAL_TABLET | Freq: Every day | ORAL | Status: AC

## 2015-05-13 NOTE — Clinical Documentation Improvement (Signed)
Cardiology Internal Medicine Neurology  Based on the clinical findings below, please document any associated diagnoses/conditions the patient has or may have.   Malignant effusion  Other  Clinically Undetermined  Supporting Information: -- Stage IV gastric cancer, breast cancer with mets to liver current undergoing chemo  Please exercise your independent, professional judgment when responding. A specific answer is not anticipated or expected.  Thank You, Ezekiel Ina RN [place] [phone]

## 2015-05-13 NOTE — Plan of Care (Signed)
Problem: Safety: Goal: Ability to remain free from injury will improve Outcome: Progressing Fall precautions in place  Problem: Pain Managment: Goal: General experience of comfort will improve Outcome: Progressing Prn meds  Problem: Tissue Perfusion: Goal: Risk factors for ineffective tissue perfusion will decrease Outcome: Progressing Lovenox      Problem: Activity: Goal: Risk for activity intolerance will decrease Outcome: Progressing PT working with pt  Problem: Education: Goal: Knowledge of disease or condition will improve Outcome: Progressing Stroke booklet given    Goal: Knowledge of patient specific risk factors addressed and post discharge goals established will improve Outcome: Progressing Booklet given

## 2015-05-13 NOTE — Progress Notes (Signed)
Spoke with Judson Roch, [facility] rep at [phone], to notify of non-emergent EMS transport.  Auth notification reference given as [id number].   Service date range good from 05/13/15 - 08/11/15.   Gap exception requested to determine if services can be considered at an in-network level.

## 2015-05-13 NOTE — Progress Notes (Signed)
Chemo pump removed per protocol, patient seemed to tolerate chemotherapy well , no complaints while at bedside. Chemo pump double bagged and picked up by pharmacy, will be returned to cancer center.

## 2015-05-13 NOTE — Progress Notes (Signed)
Dulcolax supp given per pt request.

## 2015-05-13 NOTE — Progress Notes (Signed)
Clinical Social Worker informed by Fritzi Mandes, MD that patient is medically ready to discharge to SNF, Patient and family ( daughter and niece) are in a agreement with plan.  Call to [facility] to confirm that patient's bed is ready. Provided patient's room number 75 and number to call for report [phone] . All discharge information faxed to  Facility.  RN will call report and patient will discharge to [facility] via EMS.  Casimer Lanius. Latanya Presser, MSW Clinical Social Work Department [phone] 1:33 PM

## 2015-05-13 NOTE — Progress Notes (Signed)
Discharge on hold due to fact that Chemo infusion will not be completed until approx 2100 tonight.  Pt and family up dated.

## 2015-05-13 NOTE — Care Management (Signed)
Patient is going to discharge to skilled nursing.  CSW is coordinating

## 2015-05-13 NOTE — Progress Notes (Signed)
Physical Therapy Treatment Patient Details Name: Chloe Arnold MRN: [id number] DOB: Jul 21, 1945 Today's Date: 05/13/2015    History of Present Illness presented to ER secondary to dizziness, blurred vision; admitted for syncope vs. CVA.  Of note, patient with recent admission for dizziness, elevated troponin; ruled out for ACS.  Of note, patient actively receiving chemotherapy for breast cancer (via R chest port).  MRI significant for acute posterior circulation infarcts; pending CTA head/neck this date.    PT Comments    Patient very motivated to "get my strength back"; good participation with all therex.  Continues to be limited in gait distance to approx 50-60' at a time due to fatigue and elevated HR (up to 142 this date).  BERG balance score as indicated above, 25/56--indicative of very high fall risk with functional activity.  Continue to recommend use of RW and +1 assist with all mobility at this time.  Significant difficulty with all activities requiring narrowed  BOS or SLS; difficulty with R lateral trunk activation and L ant/lateral weight shift necessary to fully shift weight/stabilize on L LE.   Follow Up Recommendations  CIR     Equipment Recommendations  Rolling walker with 5" wheels    Recommendations for Other Services       Precautions / Restrictions Precautions Precautions: Fall Precaution Comments: R chest port Restrictions Weight Bearing Restrictions: No    Mobility  Bed Mobility Overal bed mobility: Modified Independent Bed Mobility: Supine to Sit              Transfers Overall transfer level: Needs assistance Equipment used: Rolling walker (2 wheeled) Transfers: Sit to/from Stand           General transfer comment: cuing for hand placement and R UE position  Ambulation/Gait   Ambulation Distance (Feet): 60 Feet (x3)       Gait velocity interpretation: <1.8 ft/sec, indicative of risk for recurrent falls General Gait Details: reciprocal  stepping pattern with slight improvement in R visual scanning (but continued cuing for obstacle negotiation).  no overt buckling or LOB, but slow and guarded gait performance.  Distance limtied to 50-60' bouts due to fatigue with minimal exertion.   Stairs            Wheelchair Mobility    Modified Rankin (Stroke Patients Only)       Balance Overall balance assessment: Needs assistance Sitting-balance support: No upper extremity supported;Feet supported Sitting balance-Leahy Scale: Good     Standing balance support: Bilateral upper extremity supported Standing balance-Leahy Scale: Fair                      Cognition Arousal/Alertness: Awake/alert Behavior During Therapy: WFL for tasks assessed/performed Overall Cognitive Status: Within Functional Limits for tasks assessed                      Exercises Other Exercises Other Exercises: BERG balance score as indicated above, 25/56--indicative of very high fall risk with functional activity.  Continue to recommend use of RW and +1 assist with all mobility at this time.  Significant difficulty with all activities requiring narrowed  BOS or SLS; difficulty with R lateral trunk activation and L ant/lateral weight shift necessary to fully shift weight/stabilize on L LE.    General Comments        Pertinent Vitals/Pain Pain Assessment: No/denies pain    Home Living  Prior Function            PT Goals (current goals can now be found in the care plan section) Acute Rehab PT Goals Patient Stated Goal: "to get better" PT Goal Formulation: With patient/family Time For Goal Achievement: 05/26/15 Potential to Achieve Goals: Good Progress towards PT goals: Progressing toward goals    Frequency  7X/week    PT Plan Current plan remains appropriate    Co-evaluation             End of Session Equipment Utilized During Treatment: Gait belt Activity Tolerance: Patient  tolerated treatment well Patient left: in chair;with call bell/phone within reach;with chair alarm set;with family/visitor present     Time: [id number] PT Time Calculation (min) (ACUTE ONLY): 26 min  Charges:  $Gait Training: 8-22 mins $Neuromuscular Re-education: 8-22 mins                    G Codes:      Moxie Kalil H. Owens Shark, PT, DPT, NCS 05/13/2015, 9:12 AM [phone]

## 2015-05-13 NOTE — Progress Notes (Signed)
I contacted Dr. Posey Pronto by phone to request an order for an inpt rehab consult. She states she has discussed with pt and she prefers to stay local for rehab due to distance to [place]. I will notify RN CM and SW. I will sign off. [phone]

## 2015-05-13 NOTE — Progress Notes (Signed)
Report called to [place] at [facility].
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
## 2015-05-13 NOTE — Evaluation (Signed)
Speech Language Pathology Evaluation Patient Details Name: Chloe Arnold MRN: [id number] DOB: Jan 18, 1946 Today's Date: 05/13/2015 Time: [id number] SLP Time Calculation (min) (ACUTE ONLY): 43 min  Problem List:  Patient Active Problem List   Diagnosis Date Noted  . CVA (cerebral infarction) 05/12/2015  . Syncope 05/06/2015  . Gastric cancer ([place]) 04/01/2015   Past Medical History:  Past Medical History  Diagnosis Date  . Hypertension   . Coronary artery disease     angioplasty  . Indigestion   . Nausea   . Coughing   . Cancer ([place])     breast  . Cancer of stomach ([place])   . Shortness of breath dyspnea    Past Surgical History:  Past Surgical History  Procedure Laterality Date  . Breast surgery    . Abdominal hysterectomy    . Goiter removed Bilateral   . Joint replacement    . Knee arthroplasty Left   . Portacath placement Right 03/31/2015    Procedure: INSERTION PORT-A-CATH;  Surgeon: Leonie Green, MD;  Location: [facility] ORS;  Service: General;  Laterality: Right;   HPI:      Assessment / Plan / Recommendation Clinical Impression   69 year old woman with new onset of left small vessel posterior circulation infarcts is presenting with moderate cognitive-communication deficits characterized by impairment of visual perceptual skills, executive skills, attention, abstraction, and memory.  Language appears to be a relative strength.  The patient will benefit from SLP evaluation when she goes to rehab for evaluation and treatment of cognitive deficits.      SLP Assessment    Montreal Cognitive Assessment (MOCA)  Version: 7.1  Visuospatieal/Executive Alternating trail making       0/1 Visuoconstruction Skills (copy 3-d design) 0/1 Draw a clock     1/3  Naming     3/3  Attention Forward digit span    1/1 Backward digit span    0/1 Vigilance     1/1 Serial 7's     0/3  Language  Verbal Fluency    0/1 Repetition     1/2  Abstraction     1/2  Delayed  Recall    1/5  Orientation     2/6  TOTAL     53/53    69 year old woman with new onset of left small vessel posterior circulation                      SLP Evaluation Prior Functioning  Cognitive/Linguistic Baseline: Within functional limits (Patient reports WNL)                 Leroy Sea, MS/CCC- SLP      Lou Miner 05/13/2015, 9:56 AM

## 2015-05-13 NOTE — Progress Notes (Signed)
Zofran 4mg  iv given for complaints off nausea, will monitor.

## 2015-05-13 NOTE — Discharge Instructions (Signed)
PT at rehab °
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
## 2015-05-14 NOTE — Progress Notes (Signed)
In touch with CSW to see if everything is still in place for patient to go to [facility] today. CSW to recheck and get back in touch with this RN.

## 2015-05-14 NOTE — Progress Notes (Signed)
Attempt to call report to RN at [facility]. RN busy at this time and will call this RN back; this RN left call back number.

## 2015-05-14 NOTE — Progress Notes (Addendum)
Report called to Charleston Ropes RN at [facility]. EMS notified and pt is ready to transfer. Assessment unchanged from this morning. IV and telemetry removed per protocol; R chest port de-accessed per protocol. Family called and updated with transfer to [facility].  Scripts included with discharge packet.
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
## 2015-05-14 NOTE — Progress Notes (Signed)
Clinical Social Worker informed by Fritzi Mandes, MD that patient is medically ready to discharge to SNF.  DC was discontinued yesterday. Patient and family ( daughter and niece) are in a agreement with plan. Call to [facility] to confirm that patient's bed is ready. Provided patient's room number 83 and number to call for report [phone] to [facility] . All discharge information faxed to Facility. RN will call report and patient will discharge to [facility] via EMS.  Casimer Lanius. Latanya Presser, MSW Clinical Social Work Department [phone] 12:10 PM

## 2015-05-15 LAB — CULTURE, BLOOD (ROUTINE X 2)
CULTURE: NO GROWTH
Culture: NO GROWTH

## 2015-05-23 ENCOUNTER — Inpatient Hospital Stay: Payer: Medicare Other

## 2015-05-23 ENCOUNTER — Encounter: Payer: Self-pay | Admitting: Emergency Medicine

## 2015-05-23 ENCOUNTER — Inpatient Hospital Stay (HOSPITAL_BASED_OUTPATIENT_CLINIC_OR_DEPARTMENT_OTHER): Payer: Medicare Other | Admitting: Oncology

## 2015-05-23 ENCOUNTER — Observation Stay
Admission: EM | Admit: 2015-05-23 | Discharge: 2015-05-24 | Disposition: A | Discharge status: Home / Self Care | Payer: Medicare Other | Attending: Internal Medicine | Admitting: Internal Medicine

## 2015-05-23 ENCOUNTER — Other Ambulatory Visit: Payer: Self-pay

## 2015-05-23 ENCOUNTER — Emergency Department: Payer: Medicare Other

## 2015-05-23 ENCOUNTER — Other Ambulatory Visit: Payer: Self-pay | Admitting: *Deleted

## 2015-05-23 VITALS — BP 108/71 | HR 92 | Temp 98.6°F | Resp 16

## 2015-05-23 DIAGNOSIS — D649 Anemia, unspecified: Secondary | ICD-10-CM | POA: Diagnosis not present

## 2015-05-23 DIAGNOSIS — I119 Hypertensive heart disease without heart failure: Secondary | ICD-10-CM | POA: Diagnosis not present

## 2015-05-23 DIAGNOSIS — D72829 Elevated white blood cell count, unspecified: Secondary | ICD-10-CM

## 2015-05-23 DIAGNOSIS — H538 Other visual disturbances: Secondary | ICD-10-CM | POA: Diagnosis not present

## 2015-05-23 DIAGNOSIS — J9811 Atelectasis: Secondary | ICD-10-CM | POA: Diagnosis not present

## 2015-05-23 DIAGNOSIS — J90 Pleural effusion, not elsewhere classified: Secondary | ICD-10-CM | POA: Diagnosis not present

## 2015-05-23 DIAGNOSIS — D638 Anemia in other chronic diseases classified elsewhere: Secondary | ICD-10-CM | POA: Diagnosis not present

## 2015-05-23 DIAGNOSIS — Z803 Family history of malignant neoplasm of breast: Secondary | ICD-10-CM | POA: Diagnosis not present

## 2015-05-23 DIAGNOSIS — C169 Malignant neoplasm of stomach, unspecified: Secondary | ICD-10-CM

## 2015-05-23 DIAGNOSIS — I10 Essential (primary) hypertension: Secondary | ICD-10-CM

## 2015-05-23 DIAGNOSIS — Z853 Personal history of malignant neoplasm of breast: Secondary | ICD-10-CM | POA: Diagnosis not present

## 2015-05-23 DIAGNOSIS — R531 Weakness: Secondary | ICD-10-CM | POA: Diagnosis not present

## 2015-05-23 DIAGNOSIS — Z79891 Long term (current) use of opiate analgesic: Secondary | ICD-10-CM | POA: Insufficient documentation

## 2015-05-23 DIAGNOSIS — R748 Abnormal levels of other serum enzymes: Secondary | ICD-10-CM | POA: Insufficient documentation

## 2015-05-23 DIAGNOSIS — R195 Other fecal abnormalities: Secondary | ICD-10-CM | POA: Diagnosis present

## 2015-05-23 DIAGNOSIS — Z85028 Personal history of other malignant neoplasm of stomach: Secondary | ICD-10-CM | POA: Insufficient documentation

## 2015-05-23 DIAGNOSIS — Z9223 Personal history of estrogen therapy: Secondary | ICD-10-CM | POA: Diagnosis not present

## 2015-05-23 DIAGNOSIS — I69398 Other sequelae of cerebral infarction: Secondary | ICD-10-CM | POA: Diagnosis not present

## 2015-05-23 DIAGNOSIS — Z79899 Other long term (current) drug therapy: Secondary | ICD-10-CM | POA: Diagnosis not present

## 2015-05-23 DIAGNOSIS — E785 Hyperlipidemia, unspecified: Secondary | ICD-10-CM | POA: Diagnosis present

## 2015-05-23 DIAGNOSIS — Z7982 Long term (current) use of aspirin: Secondary | ICD-10-CM | POA: Insufficient documentation

## 2015-05-23 DIAGNOSIS — I2510 Atherosclerotic heart disease of native coronary artery without angina pectoris: Secondary | ICD-10-CM | POA: Diagnosis present

## 2015-05-23 DIAGNOSIS — R7989 Other specified abnormal findings of blood chemistry: Secondary | ICD-10-CM | POA: Diagnosis present

## 2015-05-23 DIAGNOSIS — R05 Cough: Secondary | ICD-10-CM | POA: Diagnosis not present

## 2015-05-23 DIAGNOSIS — K219 Gastro-esophageal reflux disease without esophagitis: Secondary | ICD-10-CM | POA: Diagnosis present

## 2015-05-23 DIAGNOSIS — C787 Secondary malignant neoplasm of liver and intrahepatic bile duct: Secondary | ICD-10-CM

## 2015-05-23 DIAGNOSIS — Z8673 Personal history of transient ischemic attack (TIA), and cerebral infarction without residual deficits: Secondary | ICD-10-CM | POA: Insufficient documentation

## 2015-05-23 DIAGNOSIS — R778 Other specified abnormalities of plasma proteins: Secondary | ICD-10-CM

## 2015-05-23 HISTORY — DX: Hyperlipidemia, unspecified: E78.5

## 2015-05-23 HISTORY — DX: Gastro-esophageal reflux disease without esophagitis: K21.9

## 2015-05-23 HISTORY — DX: Anemia in other chronic diseases classified elsewhere: D63.8

## 2015-05-23 LAB — CBC WITH DIFFERENTIAL/PLATELET
BASOS ABS: 0 10*3/uL (ref 0–0.1)
BASOS PCT: 0 %
BLASTS: 0 %
Band Neutrophils: 2 %
Basophils Absolute: 0 10*3/uL (ref 0–0.1)
Basophils Relative: 0 %
EOS ABS: 0 10*3/uL (ref 0–0.7)
Eosinophils Absolute: 0 10*3/uL (ref 0–0.7)
Eosinophils Relative: 0 %
Eosinophils Relative: 0 %
HCT: 23.3 % — ABNORMAL LOW (ref 35.0–47.0)
HCT: 22 % — ABNORMAL LOW (ref 35.0–47.0)
HEMOGLOBIN: 6.7 g/dL — AB (ref 12.0–16.0)
Hemoglobin: 7 g/dL — ABNORMAL LOW (ref 12.0–16.0)
LYMPHS ABS: 2 10*3/uL (ref 1.0–3.6)
Lymphocytes Relative: 8 %
Lymphocytes Relative: 10 %
Lymphs Abs: 2.6 10*3/uL (ref 1.0–3.6)
MCH: 29.8 pg (ref 26.0–34.0)
MCH: 30 pg (ref 26.0–34.0)
MCHC: 30.2 g/dL — AB (ref 32.0–36.0)
MCHC: 30.6 g/dL — ABNORMAL LOW (ref 32.0–36.0)
MCV: 98.5 fL (ref 80.0–100.0)
MCV: 98 fL (ref 80.0–100.0)
MONO ABS: 1.8 10*3/uL — AB (ref 0.2–0.9)
MONO ABS: 1.5 10*3/uL — AB (ref 0.2–0.9)
MYELOCYTES: 0 %
Metamyelocytes Relative: 0 %
Monocytes Relative: 7 %
Monocytes Relative: 6 %
NEUTROS PCT: 85 %
NEUTROS PCT: 82 %
NRBC: 0 /100{WBCs}
Neutro Abs: 21.7 10*3/uL — ABNORMAL HIGH (ref 1.4–6.5)
Neutro Abs: 21.7 10*3/uL — ABNORMAL HIGH (ref 1.4–6.5)
Other: 0 %
PLATELETS: 187 10*3/uL (ref 150–440)
PROMYELOCYTES ABS: 0 %
Platelets: 173 10*3/uL (ref 150–440)
RBC: 2.37 MIL/uL — AB (ref 3.80–5.20)
RBC: 2.25 MIL/uL — AB (ref 3.80–5.20)
RDW: 25.5 % — AB (ref 11.5–14.5)
RDW: 25.2 % — ABNORMAL HIGH (ref 11.5–14.5)
WBC: 25.5 10*3/uL — AB (ref 3.6–11.0)
WBC: 25.8 10*3/uL — AB (ref 3.6–11.0)

## 2015-05-23 LAB — COMPREHENSIVE METABOLIC PANEL
ALT: 28 U/L (ref 14–54)
AST: 67 U/L — AB (ref 15–41)
Albumin: 2 g/dL — ABNORMAL LOW (ref 3.5–5.0)
Alkaline Phosphatase: 528 U/L — ABNORMAL HIGH (ref 38–126)
Anion gap: 11 (ref 5–15)
BILIRUBIN TOTAL: 0.8 mg/dL (ref 0.3–1.2)
BUN: 22 mg/dL — AB (ref 6–20)
CO2: 21 mmol/L — ABNORMAL LOW (ref 22–32)
CREATININE: 1.04 mg/dL — AB (ref 0.44–1.00)
Calcium: 7.2 mg/dL — ABNORMAL LOW (ref 8.9–10.3)
Chloride: 100 mmol/L — ABNORMAL LOW (ref 101–111)
GFR, EST NON AFRICAN AMERICAN: 53 mL/min — AB (ref >60)
Glucose, Bld: 88 mg/dL (ref 65–99)
POTASSIUM: 3.9 mmol/L (ref 3.5–5.1)
Sodium: 132 mmol/L — ABNORMAL LOW (ref 135–145)
TOTAL PROTEIN: 5.9 g/dL — AB (ref 6.5–8.1)

## 2015-05-23 LAB — BASIC METABOLIC PANEL
Anion gap: 13 (ref 5–15)
BUN: 23 mg/dL — AB (ref 6–20)
CHLORIDE: 102 mmol/L (ref 101–111)
CO2: 20 mmol/L — ABNORMAL LOW (ref 22–32)
Calcium: 7.3 mg/dL — ABNORMAL LOW (ref 8.9–10.3)
Creatinine, Ser: 1.15 mg/dL — ABNORMAL HIGH (ref 0.44–1.00)
GFR calc Af Amer: 54 mL/min — ABNORMAL LOW (ref >60)
GFR calc non Af Amer: 47 mL/min — ABNORMAL LOW (ref >60)
Glucose, Bld: 85 mg/dL (ref 65–99)
POTASSIUM: 3.7 mmol/L (ref 3.5–5.1)
SODIUM: 135 mmol/L (ref 135–145)

## 2015-05-23 LAB — TROPONIN I: TROPONIN I: 0.18 ng/mL — AB (ref <0.031)

## 2015-05-23 LAB — PROTIME-INR
INR: 1.61
PROTHROMBIN TIME: 19.2 s — AB (ref 11.4–15.0)

## 2015-05-23 LAB — ABO/RH: ABO/RH(D): O POS

## 2015-05-23 LAB — PREPARE RBC (CROSSMATCH)

## 2015-05-23 MED ORDER — ONDANSETRON HCL 4 MG PO TABS: 4.0000 mg | ORAL_TABLET | Freq: Four times a day (QID) | ORAL | Status: DC | PRN

## 2015-05-23 MED ORDER — PANTOPRAZOLE SODIUM 40 MG PO TBEC
40.0000 mg | DELAYED_RELEASE_TABLET | Freq: Every day | ORAL | Status: DC
  Administered 2015-05-24: 40 mg via ORAL
  Filled 2015-05-23: qty 2
  Filled 2015-05-23: qty 1

## 2015-05-23 MED ORDER — ENOXAPARIN SODIUM 40 MG/0.4ML ~~LOC~~ SOLN
40.0000 mg | SUBCUTANEOUS | Status: DC
  Administered 2015-05-23: 40 mg via SUBCUTANEOUS
  Filled 2015-05-23: qty 0.4

## 2015-05-23 MED ORDER — SODIUM CHLORIDE 0.9 % IV SOLN
10.0000 mL/h | Freq: Once | INTRAVENOUS | Status: AC
  Administered 2015-05-23: 10 mL/h via INTRAVENOUS

## 2015-05-23 MED ORDER — BENZONATATE 100 MG PO CAPS: 100.0000 mg | ORAL_CAPSULE | Freq: Four times a day (QID) | ORAL | Status: DC | PRN

## 2015-05-23 MED ORDER — METOPROLOL TARTRATE 25 MG PO TABS
25.0000 mg | ORAL_TABLET | Freq: Two times a day (BID) | ORAL | Status: DC
  Administered 2015-05-23 – 2015-05-24 (×2): 25 mg via ORAL
  Filled 2015-05-23 (×2): qty 1

## 2015-05-23 MED ORDER — ONDANSETRON HCL 4 MG/2ML IJ SOLN: 4.0000 mg | Freq: Four times a day (QID) | INTRAMUSCULAR | Status: DC | PRN

## 2015-05-23 MED ORDER — ASPIRIN 81 MG PO CHEW
81.0000 mg | CHEWABLE_TABLET | Freq: Every day | ORAL | Status: DC
  Administered 2015-05-24: 81 mg via ORAL
  Filled 2015-05-23: qty 1

## 2015-05-23 MED ORDER — ACETAMINOPHEN 325 MG PO TABS
650.0000 mg | ORAL_TABLET | Freq: Four times a day (QID) | ORAL | Status: DC | PRN
  Administered 2015-05-23: 23:00:00 650 mg via ORAL
  Filled 2015-05-23: qty 2

## 2015-05-23 MED ORDER — ACETAMINOPHEN 650 MG RE SUPP: 650.0000 mg | Freq: Four times a day (QID) | RECTAL | Status: DC | PRN

## 2015-05-23 MED ORDER — ATORVASTATIN CALCIUM 20 MG PO TABS: 40.0000 mg | ORAL_TABLET | Freq: Every day | ORAL | Status: DC

## 2015-05-23 MED ORDER — HYDROCODONE-ACETAMINOPHEN 10-325 MG PO TABS: 1.0000 | ORAL_TABLET | Freq: Four times a day (QID) | ORAL | Status: DC | PRN
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
## 2015-05-23 NOTE — ED Provider Notes (Signed)
[facility] Emergency Department Provider Note     Time seen: ----------------------------------------- 6:08 PM on 05/23/2015 -----------------------------------------    I have reviewed the triage vital signs and the nursing notes.   HISTORY  Chief Complaint Abnormal Lab    HPI Chloe Arnold is a 69 y.o. female brought the ER for abnormal lab work. Patient had outpatient labs which revealed hemoglobin around 6. Patient has not had any symptoms, denies fevers, chills, chest pain, shortness of breath, nausea vomiting or diarrhea. Patient has not noted any blood in her stool or urine. Patient is had a blood transfusion before but she isn't sure of the reason why.   Past Medical History  Diagnosis Date  . Hypertension   . Coronary artery disease     angioplasty  . Indigestion   . Nausea   . Coughing   . Cancer ([place])     breast  . Cancer of stomach ([place])   . Shortness of breath dyspnea     Patient Active Problem List   Diagnosis Date Noted  . CVA (cerebral infarction) 05/12/2015  . Syncope 05/06/2015  . Gastric cancer ([place]) 04/01/2015    Past Surgical History  Procedure Laterality Date  . Breast surgery    . Abdominal hysterectomy    . Goiter removed Bilateral   . Joint replacement    . Knee arthroplasty Left   . Portacath placement Right 03/31/2015    Procedure: INSERTION PORT-A-CATH;  Surgeon: Leonie Green, MD;  Location: [facility] ORS;  Service: General;  Laterality: Right;    Allergies Review of patient's allergies indicates no known allergies.  Social History Social History  Substance Use Topics  . Smoking status: Never Smoker   . Smokeless tobacco: Not on file  . Alcohol Use: No    Review of Systems Constitutional: Negative for fever. Eyes: Negative for visual changes. ENT: Negative for sore throat. Cardiovascular: Negative for chest pain. Respiratory: Negative for shortness of breath. Gastrointestinal: Negative for  abdominal pain, vomiting and diarrhea. Genitourinary: Negative for dysuria. Musculoskeletal: Negative for back pain. Skin: Negative for rash. Neurological: Negative for headaches, focal weakness or numbness.  10-point ROS otherwise negative.  ____________________________________________   PHYSICAL EXAM:  VITAL SIGNS: ED Triage Vitals  Enc Vitals Group     BP 05/23/15 1801 120/60 mmHg     Pulse Rate 05/23/15 1801 95     Resp 05/23/15 1801 18     Temp 05/23/15 1801 98.4 F (36.9 C)     Temp Source 05/23/15 1801 Oral     SpO2 05/23/15 1801 95 %     Weight 05/23/15 1801 191 lb (86.637 kg)     Height 05/23/15 1801 5\' 5"  (1.651 m)     Head Cir --      Peak Flow --      Pain Score --      Pain Loc --      Pain Edu? --      Excl. in [place]? --     Constitutional: Alert and oriented. Well appearing and in no distress. Eyes: Pale conjunctiva. PERRL. Normal extraocular movements. ENT   Head: Normocephalic and atraumatic.   Nose: No congestion/rhinnorhea.   Mouth/Throat: Mucous membranes are moist.   Neck: No stridor. Cardiovascular: Normal rate, regular rhythm. Normal and symmetric distal pulses are present in all extremities. No murmurs, rubs, or gallops. Respiratory: Normal respiratory effort without tachypnea nor retractions. Breath sounds are clear and equal bilaterally. No wheezes/rales/rhonchi. Gastrointestinal: Soft and nontender.  No distention. No abdominal bruits.  Rectal: Nontender, no hemorrhoids, heme positive stool. Musculoskeletal: Nontender with normal range of motion in all extremities. No joint effusions.  No lower extremity tenderness, markedly pitting edema of the lower extremities to the hips bilaterally Neurologic:  Normal speech and language. No gross focal neurologic deficits are appreciated. Speech is normal. No gait instability. Skin:  Skin is warm, dry and intact. Pallor is noted Psychiatric: Mood and affect are normal. Speech and behavior are  normal. Patient exhibits appropriate insight and judgment. ____________________________________________  EKG: Interpreted by me. Sinus rhythm with a rate of 97 bpm, normal PR interval, normal QS with, long QT interval. Nonspecific ST-T wave changes.  ____________________________________________  ED COURSE:  Pertinent labs & imaging results that were available during my care of the patient were reviewed by me and considered in my medical decision making (see chart for details). Patient with likely anemia due to to GI bleeding. We'll recheck labs and likely transfuse. ____________________________________________    LABS (pertinent positives/negatives)  Labs Reviewed  CBC WITH DIFFERENTIAL/PLATELET - Abnormal; Notable for the following:    WBC 25.8 (*)    RBC 2.25 (*)    Hemoglobin 6.7 (*)    HCT 22.0 (*)    MCHC 30.6 (*)    RDW 25.2 (*)    All other components within normal limits  BASIC METABOLIC PANEL - Abnormal; Notable for the following:    CO2 20 (*)    BUN 23 (*)    Creatinine, Ser 1.15 (*)    Calcium 7.3 (*)    GFR calc non Af Amer 47 (*)    GFR calc Af Amer 54 (*)    All other components within normal limits  TROPONIN I - Abnormal; Notable for the following:    Troponin I 0.18 (*)    All other components within normal limits  PROTIME-INR - Abnormal; Notable for the following:    Prothrombin Time 19.2 (*)    All other components within normal limits  TYPE AND SCREEN  ABO/RH  PREPARE RBC (CROSSMATCH)   CRITICAL CARE Performed by: Earleen Newport   Total critical care time: 30 minutes  Critical care time was exclusive of separately billable procedures and treating other patients.  Critical care was necessary to treat or prevent imminent or life-threatening deterioration.  Critical care was time spent personally by me on the following activities: development of treatment plan with patient and/or surrogate as well as nursing, discussions with consultants,  evaluation of patient's response to treatment, examination of patient, obtaining history from patient or surrogate, ordering and performing treatments and interventions, ordering and review of laboratory studies, ordering and review of radiographic studies, pulse oximetry and re-evaluation of patient's condition.   RADIOLOGY Images were viewed by me  Chest x-ray IMPRESSION: Right pleural effusion with basilar atelectasis unchanged since previous study. ____________________________________________  FINAL ASSESSMENT AND PLAN  Anemia, heme positive stool, elevated troponin, stage IV stomach cancer  Plan: Patient with labs and imaging as dictated above. Patient presents here due to abnormal outpatient lab work, she was confirmed be anemic, there is a GI bleed component as she is heme-positive rectally. I discussed with the oncologist recommends blood transfusion. Troponin is elevated likely from anemia. She would benefit from hospitalization.   Earleen Newport, MD   Earleen Newport, MD 05/23/15 [id number]

## 2015-05-23 NOTE — H&P (Signed)
[facility] [place] NAME: Chloe Arnold    MR#:  [id number]  DATE OF BIRTH:  26-Feb-1946  DATE OF ADMISSION:  05/23/2015  PRIMARY CARE PHYSICIAN: Lloyd Huger, MD   REQUESTING/REFERRING PHYSICIAN: Jimmye Norman, M.D.  CHIEF COMPLAINT:   Chief Complaint  Patient presents with  . Abnormal Lab    HISTORY OF PRESENT ILLNESS:  Chloe Arnold  is a 69 y.o. female who presents from home after being called with abnormal lab values. She was called today and told that her hemoglobin was significantly low, around 6. She was advised to come in in the ED for evaluation and possible transfusion. Hemoglobin here was 7 and 6.7 on repeat. Patient denies any significant related symptoms. She does have a history of cancer and baseline anemia of chronic disease. However, she's never required transfusion before, and this is the lowest value she's had. She denies any melena or hematemesis, denies any abdominal pain, and per report has never had guaiac positive stool. She has leukocytosis, but this seems to be chronic. She denies any symptoms of infection. She also had a positive troponin, which is unclear what her baseline usually is that she's had varying positive troponins in the past on chart review. Hospitals were called for admission for transfusion for her anemia as well as evaluation of her troponin. She does endorse some baseline malaise and general lack of energy, but states these symptoms are not new.  PAST MEDICAL HISTORY:   Past Medical History  Diagnosis Date  . Hypertension   . Coronary artery disease     angioplasty  . GERD (gastroesophageal reflux disease)   . Cancer ([place])     breast  . Cancer of stomach ([place])   . HLD (hyperlipidemia)   . Anemia of chronic disease     PAST SURGICAL HISTORY:   Past Surgical History  Procedure Laterality Date  . Breast surgery    . Abdominal hysterectomy    . Goiter removed Bilateral   . Joint  replacement    . Knee arthroplasty Left   . Portacath placement Right 03/31/2015    Procedure: INSERTION PORT-A-CATH;  Surgeon: Leonie Green, MD;  Location: [facility] ORS;  Service: General;  Laterality: Right;    SOCIAL HISTORY:   Social History  Substance Use Topics  . Smoking status: Never Smoker   . Smokeless tobacco: Not on file  . Alcohol Use: No    FAMILY HISTORY:  History reviewed. No pertinent family history.  DRUG ALLERGIES:  No Known Allergies  MEDICATIONS AT HOME:   Prior to Admission medications   Medication Sig Start Date End Date Taking? Authorizing Provider  acetaminophen (TYLENOL) 325 MG tablet Take 650 mg by mouth every 6 (six) hours as needed for moderate pain, fever or headache.    Historical Provider, MD  aspirin 81 MG chewable tablet Chew 81 mg by mouth daily.    Historical Provider, MD  atorvastatin (LIPITOR) 40 MG tablet Take 1 tablet (40 mg total) by mouth daily at 6 PM. 05/13/15   Fritzi Mandes, MD  benzonatate (TESSALON PERLES) 100 MG capsule Take 1 capsule (100 mg total) by mouth every 6 (six) hours as needed for cough. 03/22/15 03/21/16  Lloyd Huger, MD  HYDROcodone-acetaminophen (NORCO) 10-325 MG tablet Take 1 tablet by mouth every 6 (six) hours as needed for moderate pain. 04/28/15   Lloyd Huger, MD  lidocaine-prilocaine (EMLA) cream Apply 1 application topically as needed. Apply to  port 1-2 hours prior to chemotherapy appointment. Cover with plastic wrap. Patient taking differently: Apply 1 application topically as needed (prior to chemo). Apply to port 1-2 hours prior to chemotherapy appointment. Cover with plastic wrap. 03/31/15   Lloyd Huger, MD  megestrol (MEGACE) 40 MG/ML suspension Take 10 mLs (400 mg total) by mouth daily. Patient not taking: Reported on 05/10/2015 05/09/15   Lloyd Huger, MD  metoprolol tartrate (LOPRESSOR) 25 MG tablet Take 1 tablet (25 mg total) by mouth 2 (two) times daily. 05/13/15   Fritzi Mandes, MD   ondansetron (ZOFRAN) 8 MG tablet Take 1 tablet (8 mg total) by mouth 2 (two) times daily as needed. Start on the third day after chemotherapy. Patient taking differently: Take 8 mg by mouth 2 (two) times daily as needed for nausea or vomiting. Start on the third day after chemotherapy. 04/01/15   Lloyd Huger, MD  pantoprazole (PROTONIX) 20 MG tablet Take 1 tablet (20 mg total) by mouth daily. Patient taking differently: Take 20 mg by mouth daily as needed for heartburn.  04/11/15   Lloyd Huger, MD  prochlorperazine (COMPAZINE) 10 MG tablet Take 1 tablet (10 mg total) by mouth every 6 (six) hours as needed (Nausea or vomiting). Patient taking differently: Take 10 mg by mouth every 6 (six) hours as needed for nausea or vomiting.  04/01/15   Lloyd Huger, MD    REVIEW OF SYSTEMS:  Review of Systems  Constitutional: Positive for malaise/fatigue. Negative for fever, chills and weight loss.  HENT: Negative for ear pain, hearing loss and tinnitus.   Eyes: Negative for blurred vision, double vision, pain and redness.  Respiratory: Negative for cough, hemoptysis and shortness of breath.   Cardiovascular: Negative for chest pain, palpitations, orthopnea and leg swelling.  Gastrointestinal: Negative for nausea, vomiting, abdominal pain, diarrhea and constipation.  Genitourinary: Negative for dysuria, frequency and hematuria.  Musculoskeletal: Negative for back pain, joint pain and neck pain.  Skin:       No acne, rash, or lesions  Neurological: Positive for weakness. Negative for dizziness, tremors and focal weakness.  Endo/Heme/Allergies: Negative for polydipsia. Does not bruise/bleed easily.  Psychiatric/Behavioral: Negative for depression. The patient is not nervous/anxious and does not have insomnia.      VITAL SIGNS:   Filed Vitals:   05/23/15 1801 05/23/15 1830 05/23/15 1930 05/23/15 2000  BP: 120/60 101/69 113/58 114/57  Pulse: 95 93 96 95  Temp: 98.4 F (36.9 C)      TempSrc: Oral     Resp: 18 20 23 27   Height: 5\' 5"  (1.651 m)     Weight: 86.637 kg (191 lb)     SpO2: 95% 97% 97% 95%   Wt Readings from Last 3 Encounters:  05/23/15 86.637 kg (191 lb)  05/10/15 81.602 kg (179 lb 14.4 oz)  05/12/15 81.194 kg (179 lb)    PHYSICAL EXAMINATION:  Physical Exam  Vitals reviewed. Constitutional: She is oriented to person, place, and time. She appears well-developed and well-nourished. No distress.  HENT:  Head: Normocephalic and atraumatic.  Mouth/Throat: Oropharynx is clear and moist.  Eyes: Conjunctivae and EOM are normal. Pupils are equal, round, and reactive to light. No scleral icterus.  Neck: Normal range of motion. Neck supple. No JVD present. No thyromegaly present.  Cardiovascular: Normal rate, regular rhythm and intact distal pulses.  Exam reveals no gallop and no friction rub.   No murmur heard. Respiratory: Effort normal and breath sounds normal. No respiratory distress.  She has no wheezes. She has no rales.  GI: Soft. Bowel sounds are normal. She exhibits no distension. There is no tenderness.  Musculoskeletal: Normal range of motion. She exhibits no edema.  No arthritis, no gout  Lymphadenopathy:    She has no cervical adenopathy.  Neurological: She is alert and oriented to person, place, and time. No cranial nerve deficit.  No dysarthria, no aphasia  Skin: Skin is warm and dry. No rash noted. No erythema.  Psychiatric: She has a normal mood and affect. Her behavior is normal. Judgment and thought content normal.    LABORATORY PANEL:   CBC  Recent Labs Lab 05/23/15 1814  WBC 25.8*  HGB 6.7*  HCT 22.0*  PLT 173   ------------------------------------------------------------------------------------------------------------------  Chemistries   Recent Labs Lab 05/23/15 1115 05/23/15 1814  NA 132* 135  K 3.9 3.7  CL 100* 102  CO2 21* 20*  GLUCOSE 88 85  BUN 22* 23*  CREATININE 1.04* 1.15*  CALCIUM 7.2* 7.3*  AST 67*   --   ALT 28  --   ALKPHOS 528*  --   BILITOT 0.8  --    ------------------------------------------------------------------------------------------------------------------  Cardiac Enzymes  Recent Labs Lab 05/23/15 1814  TROPONINI 0.18*   ------------------------------------------------------------------------------------------------------------------  RADIOLOGY:  Dg Chest Port 1 View  05/23/2015  CLINICAL DATA:  Patient had labs drawn today and was found to have low hemoglobin of 6.5. History of previous transfusions. Syncopal episode 1 month ago. Swelling in both legs. Weakness. EXAM: PORTABLE CHEST 1 VIEW COMPARISON:  05/10/2015 FINDINGS: Are port type central venous catheter is unchanged in position with tip over the low SVC region. No pneumothorax. There is a small right pleural effusion with basilar atelectasis. This is unchanged since previous study. Left lung appears clear and expanded. Normal heart size and pulmonary vascularity. Calcification of the aorta. IMPRESSION: Right pleural effusion with basilar atelectasis unchanged since previous study. Electronically Signed   By: Lucienne Capers M.D.   On: 05/23/2015 18:30    EKG:   Orders placed or performed during the hospital encounter of 05/23/15  . ED EKG  . ED EKG    IMPRESSION AND PLAN:  Principal Problem:   Anemia of chronic disease - hemoglobin has fallen low enough to require transfusion. Transfusion ordered from the ED, that she'll likely get this on the floor. She states she has never had transfusions before, will monitor her carefully for any adverse reactions. Active Problems:   Elevated troponin - unclear baseline and this patient. She does have a history of coronary artery disease on her chart, but denies any symptoms at this time. We will trend her troponins serially and monitor closely.   Gastric cancer ([place]) - likely cause of her anemia.   CAD (coronary artery disease) - stable, trend troponins as above,  continue home meds   HTN (hypertension) - controlled, continue home meds   HLD (hyperlipidemia) - continue home antilipid   GERD (gastroesophageal reflux disease) - continue home dose PPI  All the records are reviewed and case discussed with ED provider. Management plans discussed with the patient and/or family.  DVT PROPHYLAXIS: SubQ lovenox  GI PROPHYLAXIS: PPI at home dose  ADMISSION STATUS: Observation  CODE STATUS: Full  TOTAL TIME TAKING CARE OF THIS PATIENT: 40 minutes.    Dean Wonder FIELDING 05/23/2015, 8:56 PM  [facility]  Office  [phone]  CC: Primary care physician; Lloyd Huger, MD

## 2015-05-24 ENCOUNTER — Other Ambulatory Visit: Payer: Self-pay | Admitting: Oncology

## 2015-05-24 DIAGNOSIS — C169 Malignant neoplasm of stomach, unspecified: Secondary | ICD-10-CM

## 2015-05-24 LAB — HEMOGLOBIN AND HEMATOCRIT, BLOOD
HEMATOCRIT: 32 % — AB (ref 35.0–47.0)
Hemoglobin: 10.3 g/dL — ABNORMAL LOW (ref 12.0–16.0)

## 2015-05-24 LAB — BASIC METABOLIC PANEL
Anion gap: 8 (ref 5–15)
BUN: 23 mg/dL — AB (ref 6–20)
CHLORIDE: 104 mmol/L (ref 101–111)
CO2: 25 mmol/L (ref 22–32)
Calcium: 7.2 mg/dL — ABNORMAL LOW (ref 8.9–10.3)
Creatinine, Ser: 0.85 mg/dL (ref 0.44–1.00)
GFR calc Af Amer: >60 mL/min (ref >60)
GFR calc non Af Amer: >60 mL/min (ref >60)
GLUCOSE: 75 mg/dL (ref 65–99)
POTASSIUM: 3.6 mmol/L (ref 3.5–5.1)
Sodium: 137 mmol/L (ref 135–145)

## 2015-05-24 LAB — CBC
HEMATOCRIT: 23.7 % — AB (ref 35.0–47.0)
Hemoglobin: 7.7 g/dL — ABNORMAL LOW (ref 12.0–16.0)
MCH: 31.4 pg (ref 26.0–34.0)
MCHC: 32.5 g/dL (ref 32.0–36.0)
MCV: 96.7 fL (ref 80.0–100.0)
Platelets: 142 10*3/uL — ABNORMAL LOW (ref 150–440)
RBC: 2.45 MIL/uL — ABNORMAL LOW (ref 3.80–5.20)
RDW: 21.6 % — AB (ref 11.5–14.5)
WBC: 17.7 10*3/uL — ABNORMAL HIGH (ref 3.6–11.0)

## 2015-05-24 LAB — PREPARE RBC (CROSSMATCH)

## 2015-05-24 LAB — TROPONIN I
TROPONIN I: 0.05 ng/mL — AB (ref <0.031)
TROPONIN I: 0.1 ng/mL — AB (ref <0.031)
Troponin I: 0.07 ng/mL — ABNORMAL HIGH (ref <0.031)

## 2015-05-24 LAB — MRSA PCR SCREENING: MRSA by PCR: NEGATIVE

## 2015-05-24 LAB — MAGNESIUM
Magnesium: 1 mg/dL — ABNORMAL LOW (ref 1.7–2.4)
Magnesium: 1.1 mg/dL — ABNORMAL LOW (ref 1.7–2.4)

## 2015-05-24 LAB — PHOSPHORUS: PHOSPHORUS: 2.3 mg/dL — AB (ref 2.5–4.6)

## 2015-05-24 MED ORDER — K PHOS MONO-SOD PHOS DI & MONO 155-852-130 MG PO TABS
500.0000 mg | ORAL_TABLET | Freq: Once | ORAL | Status: AC
  Administered 2015-05-24: 18:00:00 500 mg via ORAL
  Filled 2015-05-24: qty 2

## 2015-05-24 MED ORDER — MAGNESIUM SULFATE 4 GM/100ML IV SOLN
4.0000 g | INTRAVENOUS | Status: AC
  Administered 2015-05-24: 18:00:00 4 g via INTRAVENOUS
  Filled 2015-05-24 (×2): qty 100

## 2015-05-24 MED ORDER — SODIUM CHLORIDE 0.9 % IV SOLN
Freq: Once | INTRAVENOUS | Status: AC
  Administered 2015-05-24: 09:00:00 via INTRAVENOUS

## 2015-05-24 NOTE — Care Management Obs Status (Signed)
[facility] NOTIFICATION   Patient Details  Name: Kanaya F Martinique MRN: [id number] Date of Birth: [birth year]   Medicare Observation Status Notification Given:  Yes    Shelbie Ammons, RN 05/24/2015, 11:05 AM

## 2015-05-24 NOTE — Care Management (Signed)
Discussed physical therapy evaluation for Chloe Arnold prior to discharge today with Dr. Benjie Karvonen.  Physical therapy ordered. Will evaluate Chloe Arnold ambulatory skills after blood has infused.  Telephone call to Will Ouida Sills, durable medical equipment representative for [place]. Will deliver rolling walker to Chloe Arnold room today.  Pending physical therapy's recommendations.  Shelbie Ammons RN MSN CCM Care Management [phone]

## 2015-05-24 NOTE — Discharge Summary (Addendum)
[facility] [place] NAME: Chloe Arnold    MR#:  [id number]  DATE OF BIRTH:  Jul 16, 1945  DATE OF ADMISSION:  05/23/2015 ADMITTING PHYSICIAN: Lance Coon, MD  DATE OF DISCHARGE:05/24/2015 PRIMARY CARE PHYSICIAN: Lloyd Huger, MD    ADMISSION DIAGNOSIS:  Heme positive stool [R19.5] Elevated troponin I level [R79.89] Anemia, unspecified anemia type [D64.9]  DISCHARGE DIAGNOSIS:  Principal Problem:   Anemia of chronic disease Active Problems:   Gastric cancer ([place])   CAD (coronary artery disease)   HTN (hypertension)   HLD (hyperlipidemia)   GERD (gastroesophageal reflux disease)   Elevated troponin   SECONDARY DIAGNOSIS:   Past Medical History  Diagnosis Date  . Hypertension   . Coronary artery disease     angioplasty  . GERD (gastroesophageal reflux disease)   . Cancer ([place])     breast  . Cancer of stomach ([place])   . HLD (hyperlipidemia)   . Anemia of chronic disease     HOSPITAL COURSE:   69 y/o female with Stage 4 GASTRIC cancer and recent CVA admitted for anemia.  1. Acute anemia on chronic: This is from chemotherapy and cancer. She denied melena, hematochezia or hematemesis. She received 1 unit of packed red blood cells and received 1 more unit prior to discharge. She will have follow-up with Dr. Grayland Ormond next week. I discussed the plan of care with Dr. Grayland Ormond who is in agreement.  2. Stage IV gastric cancer: Patient was recently seen by Dr. Grayland Ormond and they're considering cycle 2 of cisplatin and 5-FU pump. She will have follow-up next week.  3. Recent CVA: Patient will continue on aspirin and statin.  4. Hyperlipidemia: Continue statin.  5. Elevated troponin: Patient's troponins are trending down. It is noted during last auscultation she also had slightly elevated troponin. She was seen by Dr. Jose Persia at that time. Without to be due to demand ischemia and not ACS.  6. Elevated white blood cell  count:  The white blood cell count is trending down. this is reactive. she had no signs of acute infection.    DISCHARGE CONDITIONS AND DIET:  'stable condition Heart healthy diet  CONSULTS OBTAINED:     DRUG ALLERGIES:  No Known Allergies  DISCHARGE MEDICATIONS:   Current Discharge Medication List    CONTINUE these medications which have NOT CHANGED   Details  acetaminophen (TYLENOL) 325 MG tablet Take 650 mg by mouth every 6 (six) hours as needed for moderate pain, fever or headache.    aspirin 81 MG chewable tablet Chew 81 mg by mouth daily.    atorvastatin (LIPITOR) 40 MG tablet Take 1 tablet (40 mg total) by mouth daily at 6 PM. Qty: 30 tablet, Refills: 0    benzonatate (TESSALON PERLES) 100 MG capsule Take 1 capsule (100 mg total) by mouth every 6 (six) hours as needed for cough. Qty: 30 capsule, Refills: 1    bisacodyl (DULCOLAX) 5 MG EC tablet Take 10 mg by mouth daily as needed for moderate constipation.    HYDROcodone-acetaminophen (NORCO) 10-325 MG tablet Take 1 tablet by mouth every 6 (six) hours as needed for moderate pain. Qty: 120 tablet, Refills: 0   Associated Diagnoses: Malignant neoplasm of stomach, unspecified location (HCC)    lidocaine-prilocaine (EMLA) cream Apply 1 application topically as needed. Apply to port 1-2 hours prior to chemotherapy appointment. Cover with plastic wrap. Qty: 30 g, Refills: 2    megestrol (MEGACE) 40 MG/ML suspension Take  10 mLs (400 mg total) by mouth daily. Qty: 240 mL, Refills: 0    metoprolol tartrate (LOPRESSOR) 25 MG tablet Take 1 tablet (25 mg total) by mouth 2 (two) times daily. Qty: 60 tablet, Refills: 2    ondansetron (ZOFRAN) 8 MG tablet Take 8 mg by mouth every 12 (twelve) hours as needed for nausea or vomiting.     pantoprazole (PROTONIX) 20 MG tablet Take 1 tablet (20 mg total) by mouth daily. Qty: 30 tablet, Refills: 5              Today   CHIEF COMPLAINT:   Patient is doing fairly well this  morning. Patient denies melena or hematochezia. Patient has some weakness.  VITAL SIGNS:  Blood pressure 139/66, pulse 84, temperature 98.5 F (36.9 C), temperature source Oral, resp. rate 18, height 5\' 5"  (1.651 m), weight 86.637 kg (191 lb), SpO2 98 %.   REVIEW OF SYSTEMS:  Review of Systems  Constitutional: Positive for malaise/fatigue. Negative for fever and chills.  HENT: Negative for sore throat.   Eyes: Negative for blurred vision.  Respiratory: Negative for cough, hemoptysis, shortness of breath and wheezing.   Cardiovascular: Negative for chest pain, palpitations and leg swelling.  Gastrointestinal: Negative for nausea, vomiting, abdominal pain, diarrhea and blood in stool.  Genitourinary: Negative for dysuria.  Musculoskeletal: Negative for back pain.  Neurological: Positive for weakness. Negative for dizziness, tremors and headaches.  Endo/Heme/Allergies: Does not bruise/bleed easily.     PHYSICAL EXAMINATION:  GENERAL:  69 y.o.-year-old patient lying in the bed with no acute distress.  NECK:  Supple, no jugular venous distention. No thyroid enlargement, no tenderness.  LUNGS: Normal breath sounds bilaterally, no wheezing, rales,rhonchi  No use of accessory muscles of respiration.  CARDIOVASCULAR: S1, S2 normal. No murmurs, rubs, or gallops.  ABDOMEN: Soft, non-tender, non-distended. Bowel sounds present. No organomegaly or mass.  EXTREMITIES: No pedal edema, cyanosis, or clubbing.  PSYCHIATRIC: The patient is alert and oriented x 3.  SKIN: No obvious rash, lesion, or ulcer.   DATA REVIEW:   CBC  Recent Labs Lab 05/24/15 0629  WBC 17.7*  HGB 7.7*  HCT 23.7*  PLT 142*    Chemistries   Recent Labs Lab 05/23/15 1115  05/24/15 0115 05/24/15 0629  NA 132*  < >  --  137  K 3.9  < >  --  3.6  CL 100*  < >  --  104  CO2 21*  < >  --  25  GLUCOSE 88  < >  --  75  BUN 22*  < >  --  23*  CREATININE 1.04*  < >  --  0.85  CALCIUM 7.2*  < >  --  7.2*  MG  --    --  1.0*  --   AST 67*  --   --   --   ALT 28  --   --   --   ALKPHOS 528*  --   --   --   BILITOT 0.8  --   --   --   < > = values in this interval not displayed.  Cardiac Enzymes  Recent Labs Lab 05/23/15 1814 05/24/15 0115 05/24/15 0629  TROPONINI 0.18* 0.05* 0.07*    Microbiology Results  @[email or web address] @  RADIOLOGY:  Dg Chest Port 1 View  05/23/2015  CLINICAL DATA:  Patient had labs drawn today and was found to have low hemoglobin of 6.5. History of previous transfusions. Syncopal episode 1  month ago. Swelling in both legs. Weakness. EXAM: PORTABLE CHEST 1 VIEW COMPARISON:  05/10/2015 FINDINGS: Are port type central venous catheter is unchanged in position with tip over the low SVC region. No pneumothorax. There is a small right pleural effusion with basilar atelectasis. This is unchanged since previous study. Left lung appears clear and expanded. Normal heart size and pulmonary vascularity. Calcification of the aorta. IMPRESSION: Right pleural effusion with basilar atelectasis unchanged since previous study. Electronically Signed   By: Lucienne Capers M.D.   On: 05/23/2015 18:30      Management plans discussed with the patient and shes in agreement. Stable for discharge   Patient should follow up with  Dr. Grayland Ormond next week  CODE STATUS:     Code Status Orders        Start     Ordered   05/23/15 2220  Full code   Continuous     05/23/15 2219      TOTAL TIME TAKING CARE OF THIS PATIENT 35 minutes.    Note: This dictation was prepared with Dragon dictation along with smaller phrase technology. Any transcriptional errors that result from this process are unintentional.  Lawan Nanez M.D on 05/24/2015 at 8:18 AM  Between 7am to 6pm - Pager - [phone] After 6pm go to [email or web address] - password [email or web address] [facility]  Office  [phone]  CC: Primary care physician; Lloyd Huger, MD

## 2015-05-24 NOTE — Progress Notes (Signed)
New referral for [facility] services after discharge received from [facility]. Services of nursing and physical therapy have been requested. Ms. Martinique is a 69 year old woman who was admitted to [facility] from [facility] on 12/12 for treatment of anemia,  Hemoglobin was 6.7 on admission blood work. She has received 2 units of blood with a third unit ordered prior to her discharge which is planned for today. She lives at home with her daughter Sara Martinique. Her PCP is Dr. Grayland Ormond and she is scheduled to receive 5FU therapy on 12/19 for treatment of gastric cancer. She has a PMH of GERD, Breast Ca, HTN, CVA (04/2015), CAD, HLD and anemia of chronic disease. Of note patient was at [facility] for rehabilitation following a CVA in November 2016. Writer met with patient, [facility] services explained. Patient agreeable to services. Information faxed to referral intake. Plan is for patient to discharge home with her daughter via car today. Thank you for the opportunity to be involved in the care of this patient. Flo Shanks RN, BSN, [facility], [facility] [phone]

## 2015-05-24 NOTE — Care Management (Addendum)
Admitted to [facility] with the diagnosis of anemia. Discharged from this facility 05/13/15 to [facility]. Plan was for Ms. Chloe Arnold to discharge from [facility] today. Presented to the room to discharge Home Health agencies. Sleeping soundly, will come back later today. On a previous admission [facility] was discussed. Shelbie Ammons RN MSN CCM Care Management [phone]

## 2015-05-24 NOTE — Progress Notes (Signed)
Hgb 10.3, pt discharge instructions w/ pt provided by Janne Napoleon. Discharge transportation provided by pt niece.

## 2015-05-24 NOTE — Progress Notes (Signed)
AdvEdmon Crape. Edu provided and prayer. Patient to seek family advise for Adv. Dir.

## 2015-05-24 NOTE — Evaluation (Signed)
Physical Therapy Evaluation Patient Details Name: Chloe Arnold MRN: [id number] DOB: Feb 16, 1946 Today's Date: 05/24/2015   History of Present Illness  Chloe Arnold  is a 69 y.o. female who presents from SNF [employer] [facility]) after being called with abnormal lab values. She was called today and told that her hemoglobin was significantly low, around 6. She was advised to come in in the ED for evaluation and possible transfusion. Hemoglobin here was 7 and 6.7 on repeat. Patient denies any significant related symptoms. She does have a history of cancer and baseline anemia of chronic disease. However, she's never required transfusion before, and this is the lowest value she's had. She denies any melena or hematemesis, denies any abdominal pain, and per report has never had guaiac positive stool. She has leukocytosis, but this seems to be chronic. She denies any symptoms of infection. She also had a positive troponin, which is unclear what her baseline usually is that she's had varying positive troponins in the past on chart review. She does endorse some baseline malaise and general lack of energy, but states these symptoms are not new. Pt was recently admitted to [facility] for acute posterior circulation infarcts and was discharged on 05/14/15 to SNF. Pt was planning to discharge from SNF today due to desire to return home. 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 Since CVA pt reports intermittent expressive aphasia as well as mild RUE/RLE weakness.  Clinical Impression  Pt reports she was planning to discharge home from SNF today and would like to return home upon discharge from [facility]. Pt is steady and stable with all bed mobility, transfers, and ambulation only requiring minA+1 for bed mobility. She is safe with ambulation with use of rolling walker however as soon as she lets go of walker she does lose balance when attempting to achieve Rhomberg stance. Pt advised to use rolling walker at all  times at discharge. Pt will need HH PT at discharge. Pt will benefit from skilled PT services to address deficits in strength, balance, and mobility in order to return to full function at home.     Follow Up Recommendations Home health PT    Equipment Recommendations  Rolling walker with 5" wheels (already delivered by time of evaluation)    Recommendations for Other Services       Precautions / Restrictions Precautions Precautions: Fall Precaution Comments: R chest port Restrictions Weight Bearing Restrictions: No      Mobility  Bed Mobility Overal bed mobility: Needs Assistance Bed Mobility: Supine to Sit     Supine to sit: Min assist     General bed mobility comments: Pt requires very minimal assist to pull up to sitting from supine with bilateral UE. Able to scoot toward EOB with increased time required  Transfers Overall transfer level: Needs assistance Equipment used: Rolling walker (2 wheeled) Transfers: Sit to/from Stand Sit to Stand: Min guard         General transfer comment: Cues for hand placement. Decreased LE strenght and power but overall stable in standing  Ambulation/Gait Ambulation/Gait assistance: Min guard Ambulation Distance (Feet): 50 Feet Assistive device: Rolling walker (2 wheeled)     Gait velocity interpretation: <1.8 ft/sec, indicative of risk for recurrent falls General Gait Details: Reciprocal gait patternw ith decreased step length. Overall she is steady with use of rolling walker. Gait speed is decreased. No overt LOB during ambulation. HR increases to 139 during ambulation and SaO2 remains >95% throughout  Stairs  Wheelchair Mobility    Modified Rankin (Stroke Patients Only)       Balance Overall balance assessment: Needs assistance Sitting-balance support: No upper extremity supported Sitting balance-Leahy Scale: Good     Standing balance support: Bilateral upper extremity supported Standing  balance-Leahy Scale: Fair                               Pertinent Vitals/Pain Pain Assessment: No/denies pain    Home Living Family/patient expects to be discharged to:: Private residence Living Arrangements: Children Available Help at Discharge: Family (24/7 assist from daughter and niece) Type of Home: House Home Access: Stairs to enter Entrance Stairs-Rails: None Entrance Stairs-Number of Steps: 1 Home Layout: One level Home Equipment: Environmental consultant - 2 wheels;Cane - single point (no bsc, no shower chair, no wheelchair)      Prior Function Level of Independence: Independent (Prior to CVA 05/14/15)         Comments: Indep with household mobility and ADLs prior to CVA; does endorse single fall in recent weeks     Hand Dominance   Dominant Hand: Right    Extremity/Trunk Assessment   Upper Extremity Assessment: RUE deficits/detail RUE Deficits / Details: LUE deconditioned but grossly functional strength. RUE is grossly functional strength with mild weakness noted with 3+/5 R elbow extension, 4-/5 R shoulder flexion and decreased grip strength         Lower Extremity Assessment: Generalized weakness (No focal weakness identified. 4-/5 to 4/5 throughout bilat)         Communication   Communication: Expressive difficulties  Cognition Arousal/Alertness: Awake/alert Behavior During Therapy: WFL for tasks assessed/performed Overall Cognitive Status: Within Functional Limits for tasks assessed                      General Comments      Exercises        Assessment/Plan    PT Assessment Patient needs continued PT services  PT Diagnosis Difficulty walking;Generalized weakness;Hemiplegia dominant side   PT Problem List Decreased strength;Decreased range of motion;Decreased activity tolerance;Decreased balance;Decreased mobility;Decreased cognition;Decreased knowledge of use of DME;Decreased safety awareness;Decreased knowledge of  precautions;Cardiopulmonary status limiting activity  PT Treatment Interventions DME instruction;Gait training;Stair training;Therapeutic activities;Therapeutic exercise;Balance training;Neuromuscular re-education;Cognitive remediation;Patient/family education;Functional mobility training   PT Goals (Current goals can be found in the Care Plan section) Acute Rehab PT Goals Patient Stated Goal: "I just want to go back home." PT Goal Formulation: With patient/family Time For Goal Achievement: 06/07/15 Potential to Achieve Goals: Good    Frequency Min 2X/week   Barriers to discharge   Has 24/7 assist from family currently    Co-evaluation               End of Session Equipment Utilized During Treatment: Gait belt Activity Tolerance: Patient tolerated treatment well Patient left: in bed;with call bell/phone within reach;with bed alarm set;with family/visitor present Nurse Communication: Mobility status;Other (comment) (Discharge recommendations)    Functional Assessment Tool Used: clin Functional Limitation: Mobility: Walking and moving around Mobility: Walking and Moving Around Current Status [id number]): At least 40 percent but less than 60 percent impaired, limited or restricted Mobility: Walking and Moving Around Goal Status [phone]): At least 20 percent but less than 40 percent impaired, limited or restricted    Time: 1630-1650 PT Time Calculation (min) (ACUTE ONLY): 20 min   Charges:   PT Evaluation $Initial PT Evaluation Tier I: 1 Procedure  PT G Codes:   PT G-Codes **NOT FOR INPATIENT CLASS** Functional Assessment Tool Used: clin Functional Limitation: Mobility: Walking and moving around Mobility: Walking and Moving Around Current Status [id number]): At least 40 percent but less than 60 percent impaired, limited or restricted Mobility: Walking and Moving Around Goal Status [phone]): At least 20 percent but less than 40 percent impaired, limited or restricted   Phillips Grout PT, DPT   Huprich,Jason 05/24/2015, 5:04 PM

## 2015-05-24 NOTE — Progress Notes (Signed)
Discharge instructions given, pt/cg verbalized understanding. 

## 2015-05-24 NOTE — Clinical Social Work Note (Signed)
Clinical Social Work Assessment  Patient Details  Name: Chloe Arnold MRN: [id number] Date of Birth: 1945/11/23  Date of referral:  05/24/15               Reason for consult:  Facility Placement                Permission sought to share information with:  Family Supports Permission granted to share information::  Yes, Verbal Permission Granted  Name::     daughter, Chloe Arnold  Housing/Transportation Living arrangements for the past 2 months:  [place] of Information:  Patient, Adult Children Patient Interpreter Needed:  None Criminal Activity/Legal Involvement Pertinent to Current Situation/Hospitalization:  No - Comment as needed Significant Relationships:  Adult Children, Friend Lives with:  Self Do you feel safe going back to the place where you live?  Yes Need for family participation in patient care:  Yes (Comment)  Care giving concerns:  No care giving concerns identified.    Social Worker assessment / plan:  CSW met with pt to address consult as she was admitted from [facility]. Pt shared that she was going home from [facility] today. Pt is under Observation status. Pt is ready for discharge today. Pt will need Deputy as she is being discharged from [facility]. CSW spoke with pt's daughter, who will arrange transportation.   CSW confirmed dispo with facility and provided update. CSW also updated RN and MD. CSW updated RNCM, who will be following for discharge planning needs. CSW will sign off at this time, however is able to assist if needed during remainder of admission.   Employment status:  Retired Nurse, adult PT Recommendations:  Not assessed at this time Information / Referral to community resources:  Other (Comment Required) (Home Health provided by [facility])  Patient/Family's Response to care:  Pt and daughter were appreciative of CSW support.   Patient/Family's Understanding of and  Emotional Response to Diagnosis, Current Treatment, and Prognosis:  Pt and daughter were agreeable to home health services as pt was just discharged from [facility].   Emotional Assessment Appearance:  Appears stated age Attitude/Demeanor/Rapport:  Other (Appropriate) Affect (typically observed):  Accepting Orientation:  Oriented to Self, Oriented to Place, Oriented to  Time, Oriented to Situation Alcohol / Substance use:  Never Used Psych involvement (Current and /or in the community):  No (Comment)  Discharge Needs  Concerns to be addressed:  No discharge needs identified Readmission within the last 30 days:  Yes Current discharge risk:  None Barriers to Discharge:  No Barriers Identified   Darden Dates, LCSW 05/24/2015, 11:27 AM

## 2015-05-24 NOTE — Plan of Care (Signed)
Problem: Education: Goal: Knowledge of [facility] Education information/materials will improve Outcome: Progressing Patient information handout given to patient.  Patient educated on unit and oriented to room and bed features.  Education given on blood transfusion and care plan for shift.  Problem: Safety: Goal: Ability to remain free from injury will improve Outcome: Progressing Patient has generalized weakness.  Her plantar and dorsiflexion are moderate with left leg weaker than right.  Patient is on high fall risk precautions.  Bed is in lowest position with call bell and phone within reach.  Bed alarm on throughout shift.

## 2015-05-25 LAB — TYPE AND SCREEN
ABO/RH(D): O POS
ANTIBODY SCREEN: NEGATIVE
Unit division: 0
Unit division: 0

## 2015-05-26 ENCOUNTER — Telehealth: Payer: Self-pay

## 2015-05-26 NOTE — Telephone Encounter (Signed)
Requesting verbal order for patient to receive skilled nursing, PT/OT and social services evaluation.  They are currently trying to get patient established with a PCP and when that happens the orders can come from their office.  Verbal order from Dr. Grayland Ormond to proceed with requested home health services.

## 2015-05-30 ENCOUNTER — Inpatient Hospital Stay: Payer: Medicare Other

## 2015-05-30 ENCOUNTER — Telehealth: Payer: Self-pay | Admitting: *Deleted

## 2015-05-30 MED ORDER — MAGIC MOUTHWASH W/LIDOCAINE: 5.0000 mL | Freq: Four times a day (QID) | ORAL | Status: AC | PRN

## 2015-05-30 NOTE — Telephone Encounter (Signed)
Tongue coated white and her throat is burning. Asking for Mouthwash to be order to [facility]

## 2015-05-30 NOTE — Telephone Encounter (Signed)
That's fine.  Thank you.

## 2015-05-30 NOTE — Telephone Encounter (Signed)
faxed Vicky notified

## 2015-05-31 ENCOUNTER — Telehealth: Payer: Self-pay | Admitting: *Deleted

## 2015-05-31 NOTE — Telephone Encounter (Signed)
OK to go hospice, Malachy Mood informed and Team [place] asked to fax an order for hospice care

## 2015-05-31 NOTE — Telephone Encounter (Signed)
Asking for [place] to fax an order to [facility] [phone] for a 3 in 1 [facility] and a standard Wheel Chair. Still trying to get a PCP. Asking for order for OT as well.

## 2015-05-31 NOTE — Telephone Encounter (Signed)
Hospice referral faxed.

## 2015-05-31 NOTE — Telephone Encounter (Signed)
Patietn lethargic, family tearful said she cannot go through any more tx and want hospice services. Needs an order faxed

## 2015-06-07 ENCOUNTER — Inpatient Hospital Stay: Payer: Medicare Other

## 2015-06-07 ENCOUNTER — Inpatient Hospital Stay: Payer: Medicare Other | Admitting: Oncology
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
## 2015-06-12 DEATH — deceased
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
## 2016-02-05 IMAGING — MR MR HEAD WO/W CM
10 of 13 series · 34 of 48 positions shown · IV contrast (multihance)
Comparison: Head CT 05/10/2015

CLINICAL DATA: Syncope. Dizziness, worse in the past week. Blurry
vision.

EXAM:
MRI HEAD WITHOUT AND WITH CONTRAST
TECHNIQUE: Multiplanar, multiecho pulse sequences of the brain and surrounding
structures were obtained without and with intravenous contrast.
CONTRAST:  16mL MULTIHANCE GADOBENATE DIMEGLUMINE 529 MG/ML IV SOLN

[Series 4: DWI · axial · 4.0mm · 0.94mm/px · z∈[+9,+168]mm · 4 of 44 slices shown (1 of 4)]
[im 1/44]
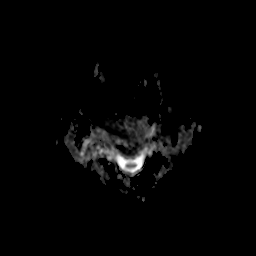
[im 15/44]
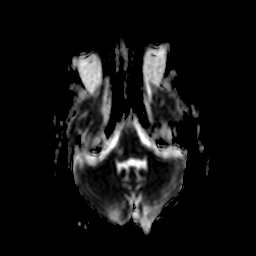
[im 29/44]
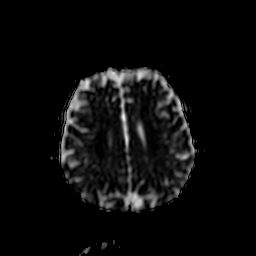
[im 44/44]
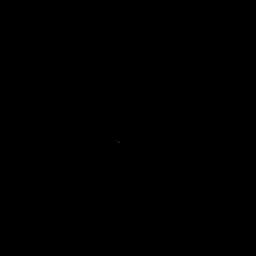

[Series 5: DWI · axial · 4.0mm · 0.94mm/px · z∈[+9,+164]mm · 4 of 38 slices shown (2 of 4)]
[im 1/38]
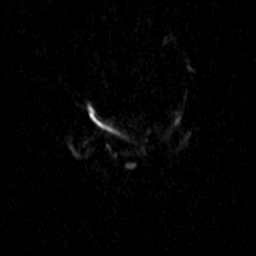
[im 13/38]
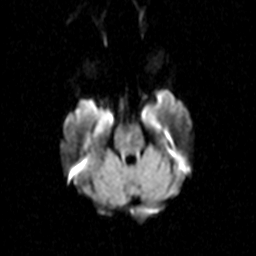
[im 25/38]
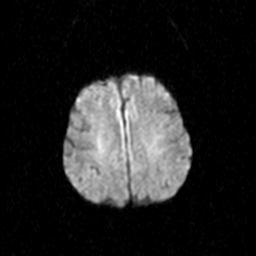
[im 38/38]
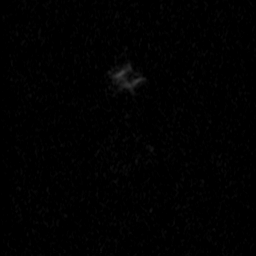

[Series 7: DWI · coronal · 5.0mm · 0.94mm/px · 4 of 40 slices shown (3 of 4)]
[im 1/40]
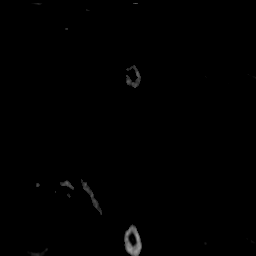
[im 14/40]
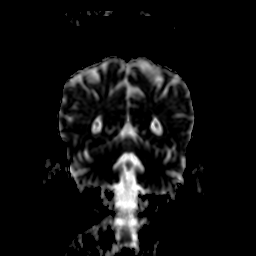
[im 27/40]
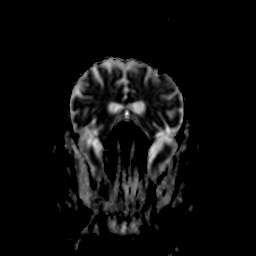
[im 40/40]
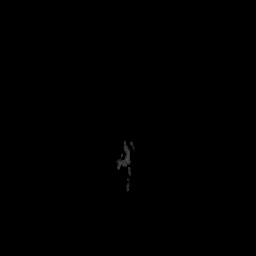

[Series 8: DWI · coronal · 5.0mm · 0.94mm/px · 4 of 36 slices shown (4 of 4)]
[im 1/36]
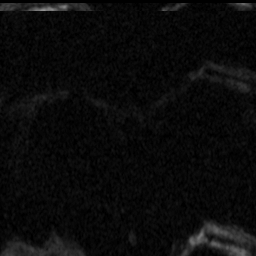
[im 12/36]
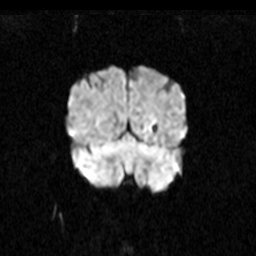
[im 24/36]
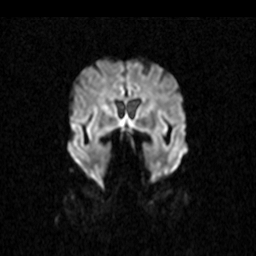
[im 36/36]
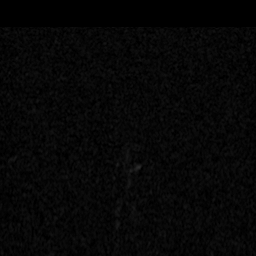

[Series 9: T2 · axial · 5.0mm · 0.68mm/px · z∈[+6,+163]mm · 3 of 27 slices shown (1 of 2)]
[im 1/27]
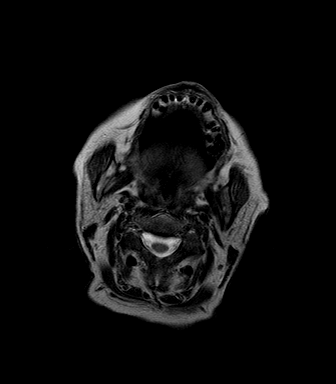
[im 14/27]
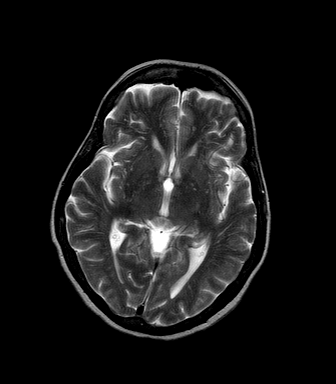
[im 27/27]
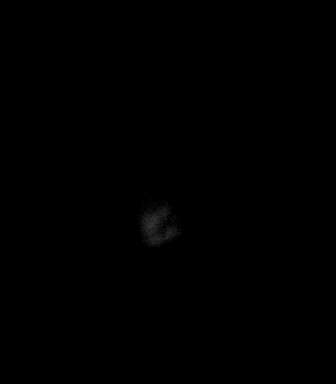

[Series 10: FLAIR · axial · 5.0mm · 0.90mm/px · z∈[+2,+158]mm · 3 of 27 slices shown]
[im 1/27]
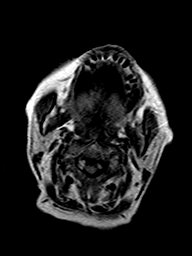
[im 14/27]
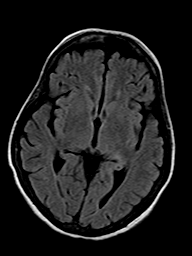
[im 27/27]
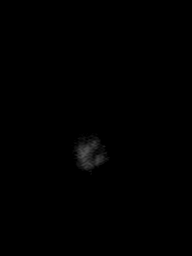

[Series 11: T2 · axial · 5.0mm · 0.45mm/px · z∈[+1,+157]mm · 3 of 27 slices shown (2 of 2)]
[im 1/27]
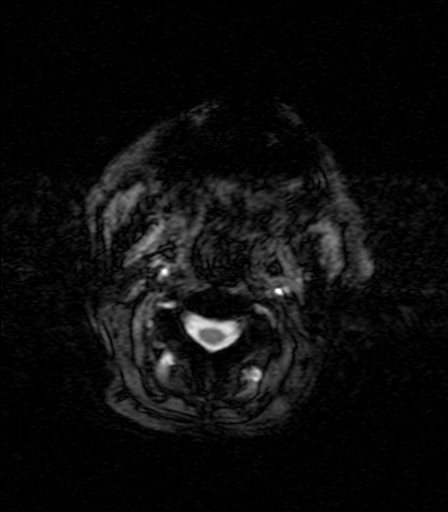
[im 14/27]
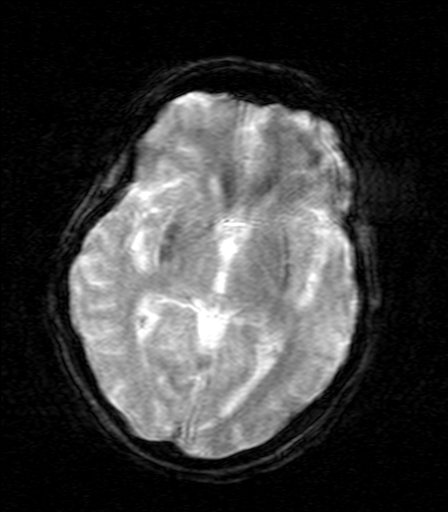
[im 27/27]
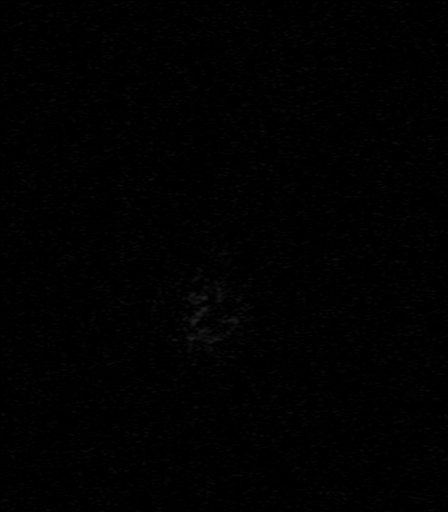

[Series 13: T2 post-contrast · coronal · 5.0mm · 0.51mm/px · 3 of 31 slices shown]
[im 1/31]
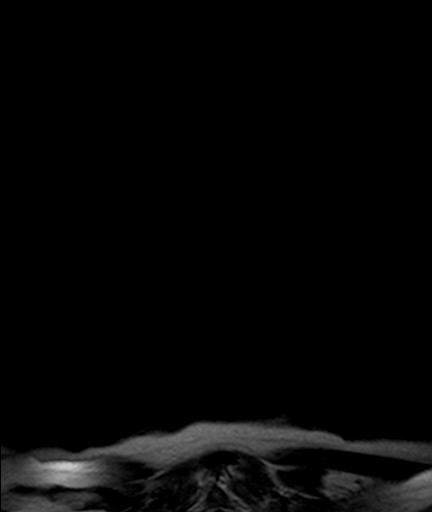
[im 16/31]
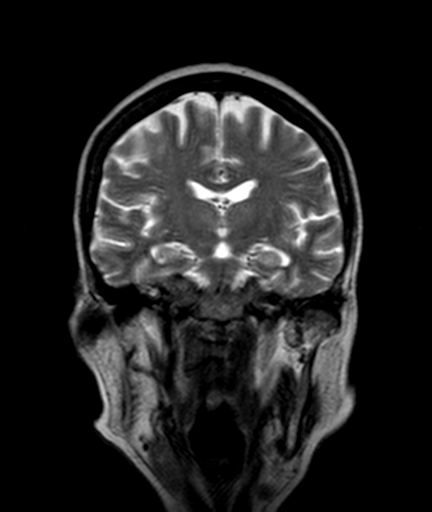
[im 31/31]
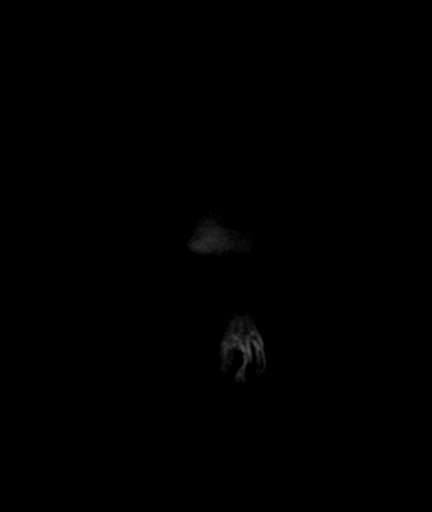

[Series 15: T1 post-contrast · coronal · 5.0mm · 0.49mm/px · 3 of 31 slices shown (1 of 2)]
[im 1/31]
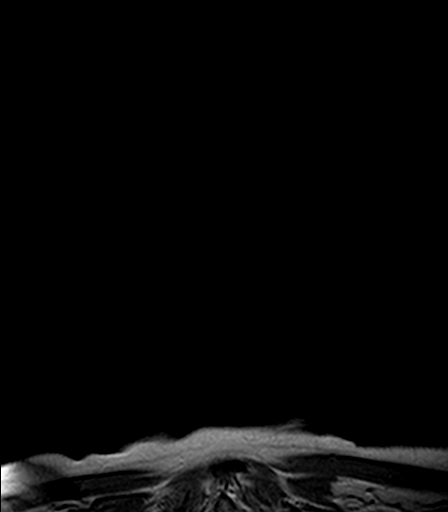
[im 16/31]
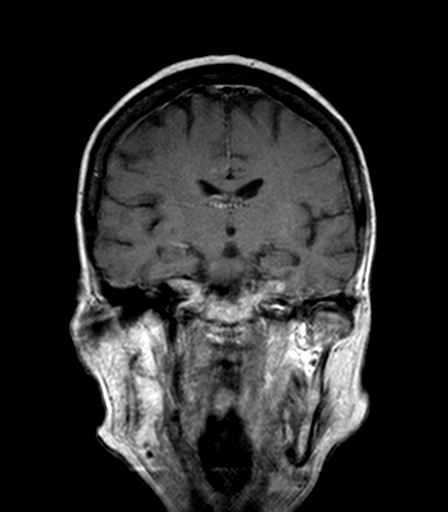
[im 31/31]
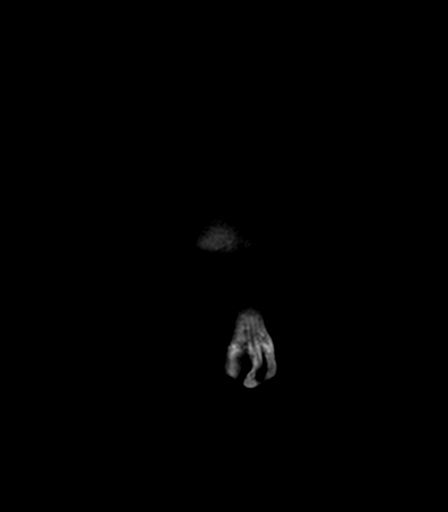

[Series 16: T1 post-contrast · sagittal · 5.0mm · 0.38mm/px · 3 of 28 slices shown (2 of 2)]
[im 1/28]
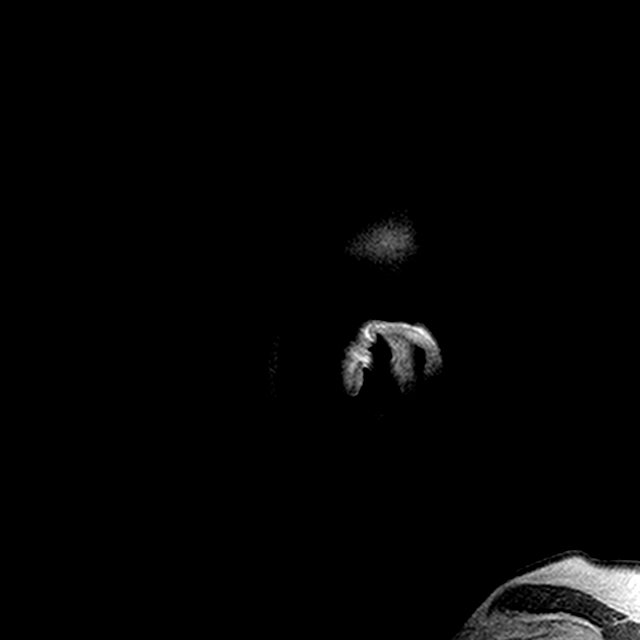
[im 14/28]
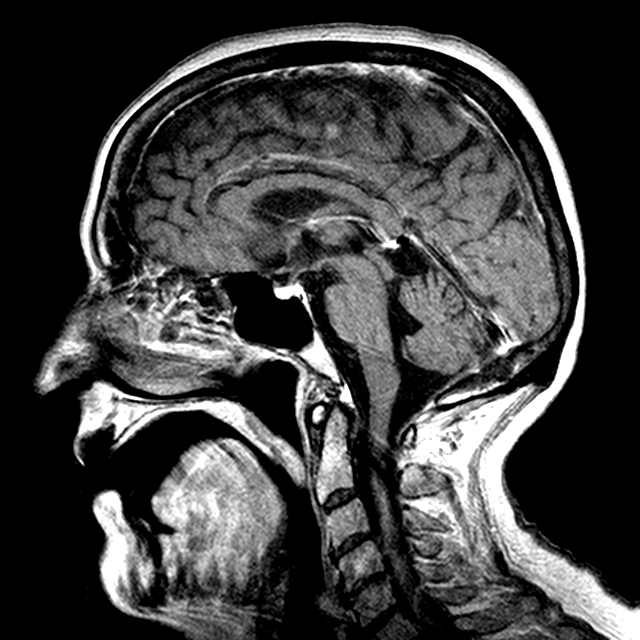
[im 28/28]
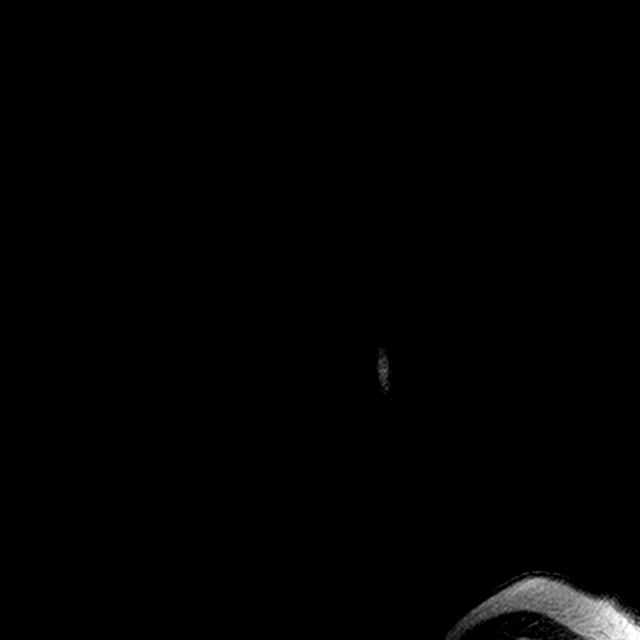

[34 of 48 positions shown; findings below may reference images not displayed]

FINDINGS: There are patchy, small posterior circulation acute infarcts
involving the left thalamus, posterior left hippocampus, and white
matter about the atrium and occipital horn of the left lateral
ventricle. There is a single punctate focus of mildly increased
diffusion-weighted signal in the right centrum semiovale without
corresponding restricted diffusion identified, possibly representing
a subacute or chronic small vessel insult.

There is no evidence of intracranial hemorrhage, mass, midline
shift, or extra-axial fluid collection. Mild generalized cerebral
atrophy is within normal limits for age. No abnormal enhancement is
identified.

Orbits are unremarkable. Paranasal sinuses and mastoid air cells are
clear. Major intracranial vascular flow voids are preserved.
IMPRESSION: 1. Acute posterior circulation infarcts involving the left
hippocampus, left thalamus, and periventricular white matter.
2. Punctate, subacute to chronic small-vessel insult in the right
centrum semiovale.

## 2016-06-08 ENCOUNTER — Other Ambulatory Visit: Payer: Self-pay | Admitting: Nurse Practitioner

## 2023-05-16 ENCOUNTER — Encounter: Payer: Self-pay | Admitting: Nurse Practitioner
